# Patient Record
Sex: Male | Born: 1935 | Race: White | Hispanic: No | Marital: Married | Smoking: Never smoker
Health system: Southern US, Community
[De-identification: ages and names within clinical notes are randomized; demographics above are authoritative.]

## PROBLEM LIST (undated history)

## (undated) DIAGNOSIS — L570 Actinic keratosis: Secondary | ICD-10-CM

## (undated) DIAGNOSIS — I10 Essential (primary) hypertension: Secondary | ICD-10-CM

## (undated) DIAGNOSIS — C4491 Basal cell carcinoma of skin, unspecified: Secondary | ICD-10-CM

## (undated) HISTORY — DX: Actinic keratosis: L57.0

---
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
## 1990-10-02 HISTORY — PX: POLYPECTOMY: SHX149

## 2006-04-27 ENCOUNTER — Ambulatory Visit: Payer: Self-pay | Admitting: Unknown Physician Specialty

## 2010-09-20 ENCOUNTER — Ambulatory Visit: Payer: Self-pay | Admitting: Unknown Physician Specialty

## 2010-09-21 LAB — PATHOLOGY REPORT

## 2012-04-17 ENCOUNTER — Inpatient Hospital Stay: Payer: Self-pay | Admitting: Internal Medicine

## 2012-04-17 LAB — CBC WITH DIFFERENTIAL/PLATELET
Basophil #: 0.1 10*3/uL (ref 0.0–0.1)
Basophil %: 0.6 %
Eosinophil #: 0 10*3/uL (ref 0.0–0.7)
HCT: 31.1 % — ABNORMAL LOW (ref 40.0–52.0)
HGB: 10.7 g/dL — ABNORMAL LOW (ref 13.0–18.0)
Lymphocyte #: 2.3 10*3/uL (ref 1.0–3.6)
Lymphocyte %: 15.9 %
MCH: 30.9 pg (ref 26.0–34.0)
MCHC: 34.4 g/dL (ref 32.0–36.0)
MCV: 90 fL (ref 80–100)
Monocyte #: 0.6 x10 3/mm (ref 0.2–1.0)
Neutrophil #: 11.7 10*3/uL — ABNORMAL HIGH (ref 1.4–6.5)
Platelet: 241 10*3/uL (ref 150–440)
RDW: 13.7 % (ref 11.5–14.5)
WBC: 14.8 10*3/uL — ABNORMAL HIGH (ref 3.8–10.6)

## 2012-04-17 LAB — COMPREHENSIVE METABOLIC PANEL
Albumin: 3.6 g/dL (ref 3.4–5.0)
Alkaline Phosphatase: 62 U/L (ref 50–136)
Calcium, Total: 8.9 mg/dL (ref 8.5–10.1)
Co2: 24 mmol/L (ref 21–32)
Creatinine: 1.25 mg/dL (ref 0.60–1.30)
EGFR (Non-African Amer.): 56 — ABNORMAL LOW
Glucose: 141 mg/dL — ABNORMAL HIGH (ref 65–99)
Osmolality: 298 (ref 275–301)
Potassium: 3.6 mmol/L (ref 3.5–5.1)
SGPT (ALT): 25 U/L
Total Protein: 6.4 g/dL (ref 6.4–8.2)

## 2012-04-17 LAB — PROTIME-INR: INR: 1

## 2012-04-17 LAB — HEMOGLOBIN: HGB: 8.6 g/dL — ABNORMAL LOW (ref 13.0–18.0)

## 2012-04-18 LAB — CBC WITH DIFFERENTIAL/PLATELET
Basophil #: 0.1 10*3/uL (ref 0.0–0.1)
Basophil %: 1 %
Eosinophil #: 0.1 10*3/uL (ref 0.0–0.7)
HCT: 22.6 % — ABNORMAL LOW (ref 40.0–52.0)
HGB: 7.5 g/dL — ABNORMAL LOW (ref 13.0–18.0)
Lymphocyte #: 2.5 10*3/uL (ref 1.0–3.6)
MCH: 30.3 pg (ref 26.0–34.0)
MCHC: 33.1 g/dL (ref 32.0–36.0)
MCV: 92 fL (ref 80–100)
Monocyte #: 0.5 x10 3/mm (ref 0.2–1.0)
Monocyte %: 5 %
Neutrophil #: 6.4 10*3/uL (ref 1.4–6.5)
Neutrophil %: 66.8 %
Platelet: 152 10*3/uL (ref 150–440)
RBC: 2.47 10*6/uL — ABNORMAL LOW (ref 4.40–5.90)

## 2012-04-18 LAB — COMPREHENSIVE METABOLIC PANEL
Alkaline Phosphatase: 44 U/L — ABNORMAL LOW (ref 50–136)
Anion Gap: 6 — ABNORMAL LOW (ref 7–16)
BUN: 48 mg/dL — ABNORMAL HIGH (ref 7–18)
Bilirubin,Total: 0.3 mg/dL (ref 0.2–1.0)
Chloride: 117 mmol/L — ABNORMAL HIGH (ref 98–107)
Co2: 25 mmol/L (ref 21–32)
Creatinine: 1.13 mg/dL (ref 0.60–1.30)
EGFR (African American): >60
EGFR (Non-African Amer.): >60
Glucose: 89 mg/dL (ref 65–99)
Osmolality: 306 (ref 275–301)
Potassium: 4.2 mmol/L (ref 3.5–5.1)
Sodium: 148 mmol/L — ABNORMAL HIGH (ref 136–145)
Total Protein: 4.2 g/dL — ABNORMAL LOW (ref 6.4–8.2)

## 2012-04-18 LAB — HEMOGLOBIN: HGB: 8.1 g/dL — ABNORMAL LOW (ref 13.0–18.0)

## 2012-04-19 LAB — BASIC METABOLIC PANEL
Anion Gap: 9 (ref 7–16)
BUN: 25 mg/dL — ABNORMAL HIGH (ref 7–18)
Creatinine: 0.96 mg/dL (ref 0.60–1.30)
EGFR (African American): >60
EGFR (Non-African Amer.): >60
Glucose: 83 mg/dL (ref 65–99)
Osmolality: 296 (ref 275–301)
Potassium: 3.4 mmol/L — ABNORMAL LOW (ref 3.5–5.1)

## 2012-04-19 LAB — HEMOGLOBIN: HGB: 7.8 g/dL — ABNORMAL LOW (ref 13.0–18.0)

## 2012-04-20 LAB — CBC WITH DIFFERENTIAL/PLATELET
Basophil #: 0.1 10*3/uL (ref 0.0–0.1)
Basophil %: 1.3 %
Eosinophil #: 0.2 10*3/uL (ref 0.0–0.7)
Eosinophil %: 3.5 %
HCT: 23.4 % — ABNORMAL LOW (ref 40.0–52.0)
HGB: 8.1 g/dL — ABNORMAL LOW (ref 13.0–18.0)
Lymphocyte %: 33.1 %
MCV: 91 fL (ref 80–100)
Monocyte %: 6.5 %
Neutrophil %: 55.6 %
Platelet: 143 10*3/uL — ABNORMAL LOW (ref 150–440)
RDW: 14.1 % (ref 11.5–14.5)
WBC: 6.2 10*3/uL (ref 3.8–10.6)

## 2012-04-20 LAB — BASIC METABOLIC PANEL
Anion Gap: 9 (ref 7–16)
Calcium, Total: 7.8 mg/dL — ABNORMAL LOW (ref 8.5–10.1)
Chloride: 114 mmol/L — ABNORMAL HIGH (ref 98–107)
Co2: 23 mmol/L (ref 21–32)
Creatinine: 0.9 mg/dL (ref 0.60–1.30)
EGFR (African American): >60
Osmolality: 292 (ref 275–301)
Sodium: 146 mmol/L — ABNORMAL HIGH (ref 136–145)

## 2012-10-10 ENCOUNTER — Ambulatory Visit: Payer: Self-pay | Admitting: Unknown Physician Specialty

## 2014-04-09 ENCOUNTER — Emergency Department: Payer: Self-pay | Admitting: Emergency Medicine

## 2014-04-09 LAB — URINALYSIS, COMPLETE
BACTERIA: NONE SEEN
Bilirubin,UR: NEGATIVE
Glucose,UR: NEGATIVE mg/dL (ref 0–75)
LEUKOCYTE ESTERASE: NEGATIVE
Nitrite: NEGATIVE
Ph: 5 (ref 4.5–8.0)
SQUAMOUS EPITHELIAL: NONE SEEN
Specific Gravity: 1.033 (ref 1.003–1.030)

## 2014-04-09 LAB — CBC
HCT: 49.2 % (ref 40.0–52.0)
HGB: 16.8 g/dL (ref 13.0–18.0)
MCH: 31.5 pg (ref 26.0–34.0)
MCHC: 34.2 g/dL (ref 32.0–36.0)
MCV: 92 fL (ref 80–100)
Platelet: 210 10*3/uL (ref 150–440)
RBC: 5.34 10*6/uL (ref 4.40–5.90)
RDW: 13.7 % (ref 11.5–14.5)
WBC: 15.8 10*3/uL — AB (ref 3.8–10.6)

## 2014-04-09 LAB — COMPREHENSIVE METABOLIC PANEL
ALT: 37 U/L (ref 12–78)
Albumin: 3.9 g/dL (ref 3.4–5.0)
Alkaline Phosphatase: 83 U/L
Anion Gap: 9 (ref 7–16)
BUN: 21 mg/dL — ABNORMAL HIGH (ref 7–18)
Bilirubin,Total: 0.8 mg/dL (ref 0.2–1.0)
CHLORIDE: 101 mmol/L (ref 98–107)
Calcium, Total: 9.3 mg/dL (ref 8.5–10.1)
Co2: 29 mmol/L (ref 21–32)
Creatinine: 1.45 mg/dL — ABNORMAL HIGH (ref 0.60–1.30)
EGFR (Non-African Amer.): 46 — ABNORMAL LOW
GFR CALC AF AMER: 54 — AB
Glucose: 125 mg/dL — ABNORMAL HIGH (ref 65–99)
Osmolality: 282 (ref 275–301)
Potassium: 3.3 mmol/L — ABNORMAL LOW (ref 3.5–5.1)
SGOT(AST): 25 U/L (ref 15–37)
Sodium: 139 mmol/L (ref 136–145)
Total Protein: 7.7 g/dL (ref 6.4–8.2)

## 2014-11-18 DIAGNOSIS — L259 Unspecified contact dermatitis, unspecified cause: Secondary | ICD-10-CM | POA: Diagnosis not present

## 2015-01-19 NOTE — Op Note (Signed)
PATIENT NAME:  Caleb Maldonado, Caleb Maldonado MR#:  [id number] DATE OF BIRTH:  May 17, 1936  DATE OF PROCEDURE:  04/18/2012  PREOPERATIVE DIAGNOSES:  1. Symptomatic lower gastrointestinal bleeding with hypotension requiring three units of packed red blood cell transfusion.  2. Baseline chronic hypertension.  3. History of bleeding ulcers.   POSTOPERATIVE DIAGNOSES:  1. Symptomatic lower gastrointestinal bleeding with hypotension requiring three units of packed red blood cell transfusion.  2. Baseline chronic hypertension.  3. History of bleeding ulcers.   PROCEDURES:    1. Catheter placement into two IMA branches, one into the sigmoid artery and one into the distal left colon and proximal sigmoid.  2. Aortogram and selective IMA arteriogram.  3. Microbead embolization with approximately 1 mL of 300-500 micron beads into the two above-named branches.  4. StarClose closure device, right femoral artery.   SURGEON: Algernon Huxley, M.D.   ANESTHESIA: Local with moderate conscious sedation.   ESTIMATED BLOOD LOSS: Minimal.   INDICATION FOR PROCEDURE: The patient is a 79 year old white male with lower gastrointestinal bleeding. He has received 3 units of packed red blood cells. He has hypotension as part of his bleeding and had a positive bleeding scan for what appeared to be the sigmoid colon. He is brought down for angiography with further plans for treatment for embolization of this area. Risks and benefits were discussed. Informed consent was obtained.   DESCRIPTION OF PROCEDURE: The patient was brought to the vascular interventional radiology suite. Groins were shaved and prepped and a sterile surgical field was created. The right femoral head was localized with fluoroscopy and the right femoral artery was accessed without difficulty with a Seldinger needle. J-wire and 5 French sheath was placed. A pigtail catheter was placed in the aorta at the L1 level and AP aortogram was performed. This showed normal  origins of the renals, the celiac, SMA and IMA were all seen. There was no flow-limiting stenosis in the aortoiliac segments. I then performed an RAO projection arteriogram and in one magnified view which delineated the origin of the IMA. With this I was able to cannulate this without difficulty with a VS-1 catheter into the main IMA. There were two branches that would be supplying the sigmoid colon and one at the distal left colon as well as the proximal sigmoid colon. The more distal branch was seen only in the sigmoid colon. I was able to selectively cannulate each of these with a prograde microcatheter and into both I instilled approximately one-half milliliter of 300-500 micron polyvinyl alcohol beads. The main vessels were still open following treatment, but the flow out into the peripheral vessels was sluggish. At this point I elected to terminate the procedure. The diagnostic catheter was removed. Oblique arteriogram was performed in the right femoral artery and StarClose closure device was deployed in the usual fashion with excellent hemostatic result. The patient tolerated the procedure well and was taken to the recovery room in stable condition.   ____________________________ Algernon Huxley, MD jsd:ap D: 04/18/2012 15:16:56 ET T: 04/18/2012 15:39:59 ET JOB#: [id number]  cc: Algernon Huxley, MD, <Dictator> Algernon Huxley MD ELECTRONICALLY SIGNED 04/22/2012 16:19

## 2015-01-19 NOTE — H&P (Signed)
PATIENT NAME:  Caleb Maldonado, Caleb Maldonado MR#:  [id number] DATE OF BIRTH:  [birth year]  DATE OF ADMISSION:  04/17/2012  PRIMARY DOCTOR: Einar Pheasant, MD   ER PHYSICIAN: Belva Bertin, MD   CHIEF COMPLAINT: Rectal bleeding.   HISTORY OF PRESENT ILLNESS: The patient is a 79 year old male with hypertension who came in because he had an episode of bright red blood from the rectum this morning and the patient was noted to have a black stool last night. This morning it is fresh blood from his rectum. The patient had almost two history of dark stools and had a normal bowel movement two days ago and again started to have a black stool last night and bright red blood from rectum this morning and he was about to pass out, felt very dizzy and diaphoretic and EMS came. The patient had a colonoscopy two years ago by Dr. Vira Agar that showed polyps in transverse colon and sigmoid colon which was remote. He has a strong family history of colon cancer in mother and brothers. The patient mainly complains of some pain in the right lower quadrant, not radiating type, and has no fever. No recent constipation. No vomiting. No nausea. No diarrhea recently.   PAST MEDICAL HISTORY:  1. Hypertension. 2. History of kidney stones. 3. Colon polyps removed.   ALLERGIES: No known allergies.   SOCIAL HISTORY: Previous smoker, quit 30 years ago. No alcohol. No drugs.   PAST SURGICAL HISTORY: None.   FAMILY HISTORY: Colon cancer in mother and brothers.   MEDICATIONS: 1. Lisinopril 40 mg daily.  2. Triamterene/HCTZ 37.5/25 half tablet daily.  The patient took these two medications this morning. He also takes aspirin but he didn't take it this morning.   REVIEW OF SYSTEMS: CONSTITUTIONAL: Has fatigue and weakness. EYES: No blurred vision. No cataracts. No glaucoma. ENT: No tinnitus. No epistaxis. No difficulty swallowing. RESPIRATORY: No cough. No wheezing. CARDIOVASCULAR: No chest pain. No orthopnea. GI: Has rectal bleeding. Has had  no change in bowel habits recently. GU: No dysuria. ENDOCRINE: No polyuria, nocturia. HEMATOLOGIC: No anemia. INTEGUMENTARY: No skin rashes. MUSCULOSKELETAL: No joint pain. NEUROLOGIC: No numbness. No weakness. No TIAs. PSYCH: No anxiety.   PHYSICAL EXAMINATION:   GENERAL: 79 year old male patient answering questions appropriately, looks dehydrated.   VITAL SIGNS: Temperature normal, heart rate 90, respirations 23, initial blood pressure 91/64, pulse stayed low, most recent one 101/59, heart rate 80.   HEAD: Atraumatic, normocephalic. Pupils equally reacting to light. Extraocular movements are intact. No conjunctivitis. Hearing is intact. No tympanic membrane congestion. Mucosal membranes are dry. Conjunctiva has pale color.   NECK: Thyroid nontender, not enlarged. No JVD. No carotid bruit. Supple.   RESPIRATORY: Clear to auscultation. No wheeze. No rales.   CARDIOVASCULAR: S1, S2 regular. No murmurs.   ABDOMEN: Soft.  Slight right lower quadrant tenderness present bowel sounds present. No hernias. No CVA tenderness.   MUSCULOSKELETAL: Strength 5/5 upper and lower extremities.   SKIN: No skin rashes.   NEUROLOGIC: Cranial nerves II through XII intact. No dysarthria or aphasia. Sensations are intact.   LABORATORY, DIAGNOSTIC, AND RADIOLOGICAL DATA: Nuclear medicine bleeding scan is negative for any lower GI bleeding. WBC 14.8, hemoglobin 10.7, hematocrit 31.1, platelets 241. Electrolytes sodium 140, potassium 3.6, chloride 107, bicarb 24, BUN 58, creatinine 1.25, blood glucose 141. Liver functions within normal limits.   EKG showed sinus rhythm with possible premature atrial complexes, 91 beats per minute. No ST-T changes.   ASSESSMENT AND PLAN:  1. The patient is  a 79 year old male with rectal bleeding, possibly lower GI bleed with history of polyps before. The patient complained of black stools two days before so we cannot completely exclude upper GI source as well. I spoke with Dr. Candace Cruise  who recommended to continue Protonix drip and fluids. The patient will be admitted to ICU because of hypotension and hemodynamic instability. He will be on IV fluids at 150 mL/h. Check CBC every eight hours and hold aspirin and possible colonoscopy either today or tomorrow. The patient's bleeding scan has been negative. He has slight tenderness in the right lower quadrant with elevated white count. It may probably be diverticular bleed as well. We are going to start empirically on Levaquin and Flagyl as well. Monitor CBC q.8. Avoid any blood thinners.  2. Dehydration secondary to GI bleed. Continue IV fluids. Hold BP medications and recheck BMP and vitals.   Spoke with the patient and the patient's wife at bedside.   TIME SPENT ON HISTORY AND PHYSICAL: About 60 minutes.   ____________________________ Epifanio Lesches, MD sk:drc D: 04/17/2012 14:41:00 ET T: 04/17/2012 14:57:43 ET JOB#: [id number]  cc: Epifanio Lesches, MD, <Dictator> Einar Pheasant, MD Epifanio Lesches MD ELECTRONICALLY SIGNED 04/26/2012 21:31

## 2015-01-19 NOTE — Consult Note (Signed)
Patient admitted with brisk lower GI bleeding and hypotension.  Required 3 units of PRBC.  Hgb 8.5 now.  BP still around 90/50. Bleeding scan initially negative, but this am was positive for the sigmoid colon.  Discussed risks and benefits of emoblization.  They desire to have this done and will schedule for today  Electronic Signatures: Algernon Huxley (MD)  (Signed on 18-Jul-13 13:46)  Authored  Last Updated: 18-Jul-13 13:46 by Algernon Huxley (MD)

## 2015-01-19 NOTE — Consult Note (Signed)
Chief Complaint:   Subjective/Chief Complaint Much better. No abd pain. No bleeding. Hgb relatively stable now. Diet being advanced.   VITAL SIGNS/ANCILLARY NOTES: **Vital Signs.:   19-Jul-13 15:00   Vital Signs Type Routine   Brief Assessment:   Respiratory clear BS    Gastrointestinal Normal   Lab Results: Routine Chem:  19-Jul-13 05:07    Glucose, Serum 83   BUN  25   Creatinine (comp) 0.96   Sodium, Serum  147   Potassium, Serum  3.4   Chloride, Serum  116   CO2, Serum 22   Calcium (Total), Serum  7.4   Anion Gap 9   Osmolality (calc) 296   eGFR (African American) >60   eGFR (Non-African American) >60 (eGFR values <40m/min/1.73 m2 may be an indication of chronic kidney disease (CKD). Calculated eGFR is useful in patients with stable renal function. The eGFR calculation will not be reliable in acutely ill patients when serum creatinine is changing rapidly. It is not useful in  patients on dialysis. The eGFR calculation may not be applicable to patients at the low and high extremes of body sizes, pregnant women, and vegetarians.)  Routine Hem:  19-Jul-13 05:07    Hemoglobin (CBC)  8.5 (Result(s) reported on 19 Apr 2012 at 05:32AM.)   Assessment/Plan:  Assessment/Plan:   Assessment LGI bleeding from sigmoid colon. S/P embolectomy. Stable now.    Plan Advance diet as tolerated. Moniter hgb. If remains stable, hopefully discharge soon. WIll sign off. Call GI if condition changes. Thanks   Electronic Signatures: OVerdie Shire(MD)  (Signed 19-Jul-13 12:50)  Authored: Chief Complaint, VITAL SIGNS/ANCILLARY NOTES, Brief Assessment, Lab Results, Assessment/Plan   Last Updated: 19-Jul-13 12:50 by OVerdie Shire(MD)

## 2015-01-19 NOTE — Consult Note (Signed)
Brief Consult Note: Diagnosis: gi bleeding.   Patient was seen by consultant.   Consult note dictated.   Comments: Appreciate consult for 79 y/o caucasian man with history of HTN, kidney stones, and adenomatous polyps, admitted for rectal bleeding, for evaluation of the same. Noted negative GI bleeding scan, hemoglobing o 10.7, Bun of 58 with normal creatinine. Patient reports seeing some black stools, which he initially attributed to chocolate, as far back as a month ago. Last seen this last Sun & Mon. Reports red blood from rectum, last this am. Reports occasional rql discomfort. Does feel weak, and reports some dizziness with exertion.  Denies NSAIDs other than asa 81mg  po qd, knowledge of previous  H pylori infection, other abdominal dsicomfort, acid reflux, heartburn, indigestion, NVD, early satiety, problems swalllowing, bloating. State he has never had an EGD.  Colonoscopy last 2011 with adenomatous polyps and internal hemorrhoids, no mention of diverticula. Impression and plan: GI bleeding: with increased BUN and normal creatinine, and history of black stools, cannot rule out an upper GI source. Did find in chart review that patient's hgb has been around 16-16.5 for the last few years, and history of PUD 1977. Agree with Pantoprazole gtt. Will order serial hemoglobins, and PT/INR. Agree with transfusing prn. Will plan for EGD. Risks and benefits discussed with patient.  Electronic Signatures: Stephens November H (NP)  (Signed 17-Jul-13 16:34)  Authored: Brief Consult Note   Last Updated: 17-Jul-13 16:34 by Theodore Demark (NP)

## 2015-01-19 NOTE — Discharge Summary (Signed)
PATIENT NAME:  Caleb Maldonado, Caleb Maldonado MR#:  [id number] DATE OF BIRTH:  04-25-1936  DATE OF ADMISSION:  04/17/2012 DATE OF DISCHARGE:  04/20/2012  PRIMARY CARE PHYSICIAN:  Dr. Einar Pheasant  DISCHARGE DIAGNOSES:  1. Lower GI bleed.  2. Hypertension.  3. Acute blood loss anemia. 4. Hypernatremia.  5. Hypokalemia.  6. Hypocalcemia secondary to hypoalbuminemia.  7. Macular allergic rash.   CONSULTS:  1. Dr. Candace Cruise of gastroenterology. 2. Dr. Lucky Cowboy of vascular surgery.   PROCEDURES: Angiography and embolization of sigmoid artery and distal proximal sigmoid along with aortogram.   ADMITTING HISTORY AND PHYSICAL: Please see detailed History and Physical dictated on 04/17/2012.  In brief, this is a 79 year old male patient with hypertension who presented to the Emergency Room after having an episode of bright red blood per rectum. The patient also noticed some black stool the previous night. The patient was found to have blood pressure in the low normal range and was admitted for further management to the Critical Care Unit. The patient had multiple blood transfusions, but over the last 48 hours of his stay his anemia has been stable with hemoglobin around 8. Initially Dr. Candace Cruise of gastroenterology was consulted who suggested a bleeding scan which was positive. Dr. Lucky Cowboy of vascular surgery was consulted who did an aortogram with arteriogram and embolization of two vessels of two IMA branches.   The patient has had hypotension into the 80s. Mostly his blood pressure has been between 95 and 010 systolic.  The patient has a history of hypertension and seems to have been overmedicated with blood pressures at this point. He does not have any lightheadedness.  Last blood pressure recorded is 105/60 with normal pulse. The patient is not feeling any dizziness on standing up and is being discharged home in stable condition with a normal cardiovascular and abdominal exam.   DISCHARGE MEDICATIONS:  Centrum multivitamin tablet  oral once a day.   DISCHARGE INSTRUCTIONS: Follow up with Dr. Einar Pheasant, primary care physician, for followup on his blood pressure. The patient is to return to the Emergency Room if he notices any blood pressures less than 90 systolic at home. He has been instructed to check his blood pressure twice a day, keep a log, and take to the primary care physician's office. He will be on a regular diet of regular consistency. No restrictions on activity. This plan was discussed with the patient and his wife at bedside who verbalized understanding and are okay with the plan.   TIME SPENT: Time spent today on this discharge dictation along with coordinating care and counseling of the patient was 40 minutes.    ____________________________ Leia Alf Doranne Schmutz, MD srs:bjt D: 04/20/2012 09:39:27 ET T: 04/22/2012 10:49:00 ET JOB#: [id number]  cc: Alveta Heimlich R. Sadiyah Kangas, MD, <Dictator> [place] MD ELECTRONICALLY SIGNED 05/01/2012 13:51

## 2015-01-19 NOTE — Consult Note (Signed)
PATIENT NAME:  Caleb Maldonado, Caleb Maldonado MR#:  [id number] DATE OF BIRTH:  [birth year]  DATE OF CONSULTATION:  04/18/2012  REFERRING PHYSICIAN:  Dr. Verdie Shire from gastroenterology  CONSULTING PHYSICIAN:  Algernon Huxley, MD  REASON FOR CONSULTATION: GI bleed.   HISTORY OF PRESENT ILLNESS: This is a 79 year old white male who was admitted last night with brisk lower GI bleeding. This was painless. It came on without warning. He has had lots of bright red blood per rectum. He has now received 3 units of packed red blood cells but his hemoglobin is still only 8.5. The bleeding has slowed down. He has not had as much bleeding today as yesterday. His bleeding scan which was initially negative was repeated this morning and found to be positive for the sigmoid colon. As such we are consulted for evaluation for embolization. He previously has a history of polyps in the transverse sigmoid colon which was remote, had a colonoscopy approximately two years ago without any obvious signs of significant pathology.   PAST MEDICAL HISTORY:  1. Hypertension.  2. Kidney stones.  3. Colon polyps.   PAST SURGICAL HISTORY: Colon polypectomy.   SOCIAL HISTORY: Previous smoker, quit 30 years ago. No alcohol or drug use.   FAMILY HISTORY: He has multiple family members with colon cancer including mother and siblings.    ALLERGIES: None known.   HOME MEDICATIONS:  1. Lisinopril 40 mg daily.  2. Triamterene/HCTZ 37.5/25 half tablet daily.  3. Aspirin daily.   REVIEW OF SYSTEMS: GENERAL: He feels fatigued and weak. He does not have fevers or chills. No intentional weight loss or gain. EYES: No blurred or double vision. EARS: No tinnitus or ear pain. CARDIOVASCULAR: No palpitations or chest pain. RESPIRATORY: No shortness breath or cough. GASTROINTESTINAL: As per history of present illness. GENITOURINARY: No dysuria or hematuria. ENDOCRINE: No heat or cold intolerance. HEME: Positive for GI bleeding and anemia. MUSCULOSKELETAL: No  joint pain or swelling. NEUROLOGIC: No transient ischemic attack, stroke or seizure. PSYCH: No anxiety or depression.   PHYSICAL EXAMINATION:  GENERAL: This is a well-developed, well-nourished white male who is lying quietly in the Critical Care Unit.   VITAL SIGNS: Temperature 98.1, pulse 78, blood pressure 93/51, saturations 99% on room air.   HEAD: Normocephalic and atraumatic.   EYES: Sclerae nonicteric. Conjunctivae are clear.   EARS: Normal external appearance. Hearing intact.   NECK: Supple without adenopathy or jugular venous distention.   HEART: Regular rate and rhythm without murmurs, rubs, or gallops.   LUNGS: Clear to auscultation bilaterally.   ABDOMEN: Soft, nondistended. He really does not have significant tenderness. There is no rebound or guarding.   EXTREMITIES: Mild lower extremity edema. Feet are warm and well perfused. Pedal pulses are 1+.  NEUROLOGICAL: Normal strength and tone in all four extremities.   SKIN: Warm and dry.   LABORATORY, DIAGNOSTIC, AND RADIOLOGICAL DATA: Sodium 148, potassium 4.2, chloride 117, CO2 25, BUN 48, creatinine 1.13, glucose 89. White blood cell count this morning was 9.6 and his platelet count was 152,000. He has a current hemoglobin of 8.5, it was 7.5 this morning and he received another unit of packed red blood cells. He has received a total of 3 units of packed red blood cells. Bleeding scan as described above.   ASSESSMENT AND PLAN: This is a 79 year old white male with a positive bleeding scan for the sigmoid colon. This is in association with hypotension and a 3 unit packed red blood cell transfusion. Blood loss  so far with continued anemia. I think options for treatment would include surgical resection of this area or embolization. Embolization is certainly the less invasive of the two. We discussed the risk of rebleeding, ischemia, access related complications, nephrotoxicity with the patient and the family. We also discussed  observation as an option. At this time they would like to proceed with angiography. This will be performed today.   This is a level-4 consultation.  ____________________________ Algernon Huxley, MD jsd:cms D: 04/18/2012 16:07:33 ET T: 04/18/2012 16:23:56 ET JOB#: [id number]  cc: Algernon Huxley, MD, <Dictator>  Algernon Huxley MD ELECTRONICALLY SIGNED 04/22/2012 16:19

## 2015-01-19 NOTE — Consult Note (Signed)
Chief Complaint:   Subjective/Chief Complaint Feels better after 3 units of PRBC. Was hypotensive last night with drop in hgb although there was no active bleeding. Bleeding scan repeated this AM. The scan is now positive near sigmoid area?   VITAL SIGNS/ANCILLARY NOTES: **Vital Signs.:   18-Jul-13 09:21   Pulse Pulse 78   Respirations Respirations 14   Systolic BP Systolic BP 174   Diastolic BP (mmHg) Diastolic BP (mmHg) 55   Mean BP 73   Brief Assessment:   Cardiac Regular    Respiratory clear BS    Gastrointestinal Normal   Lab Results: Hepatic:  18-Jul-13 05:11    Bilirubin, Total 0.3   Alkaline Phosphatase  44   SGPT (ALT) 15 (12-78 NOTE: NEW REFERENCE RANGE 08/25/2011)   SGOT (AST)  11   Total Protein, Serum  4.2   Albumin, Serum  2.3  Routine Chem:  18-Jul-13 05:11    Glucose, Serum 89   BUN  48   Creatinine (comp) 1.13   Sodium, Serum  148   Potassium, Serum 4.2   Chloride, Serum  117   CO2, Serum 25   Calcium (Total), Serum  7.1   Osmolality (calc) 306   eGFR (African American) >60   eGFR (Non-African American) >60 (eGFR values <58m/min/1.73 m2 may be an indication of chronic kidney disease (CKD). Calculated eGFR is useful in patients with stable renal function. The eGFR calculation will not be reliable in acutely ill patients when serum creatinine is changing rapidly. It is not useful in  patients on dialysis. The eGFR calculation may not be applicable to patients at the low and high extremes of body sizes, pregnant women, and vegetarians.)   Anion Gap  6  Routine Hem:  18-Jul-13 05:11    Hemoglobin (CBC)  7.5   WBC (CBC) 9.6   RBC (CBC)  2.47   Hematocrit (CBC)  22.6   Platelet Count (CBC) 152   MCV 92   MCH 30.3   MCHC 33.1   RDW 13.9   Neutrophil % 66.8   Lymphocyte % 25.8   Monocyte % 5.0   Eosinophil % 1.4   Basophil % 1.0   Neutrophil # 6.4   Lymphocyte # 2.5   Monocyte # 0.5   Eosinophil # 0.1   Basophil # 0.1 (Result(s)  reported on 18 Apr 2012 at 05:34AM.)    09:17    Hemoglobin (CBC)  8.5 (Result(s) reported on 18 Apr 2012 at 09:34AM.)   Radiology Results: Nuclear Med:    18-Jul-13 09:54, GI Blood Loss Study - Nuc Med   GI Blood Loss Study - Nuc Med    REASON FOR EXAM:    active bleeding  COMMENTS:       PROCEDURE: NM  - NM GI BLOOD LOSS STUDY  - Apr 18 2012  9:54AM     RESULT: Additional imaging was performed today 18 April 2012 taking   advantage of residual activity within the bowel from yesterday's GI   bleeding study.    On today's study there is considerable activity within the right colon   and transverse colon with milder amounts of activity more inferiorly.   There is a focus of increased activity which appears in the proximal to   mid sigmoid colon over the course of today's 1 hour of imaging. This may   reflect a small focus of active bleeding. Reportedly the patient has not   had a bloody stool today.  IMPRESSION:  There  may be a small focus of abnormal gastrointestinal   bleeding in the proximal to mid sigmoid colon.          Verified By: DAVID A. Martinique, M.D., MD   Assessment/Plan:  Assessment/Plan:   Assessment GI bleeding. Positive bleeding scan. Hx of colon polyps. No mention of tics or AVM's on previous colonoscopy report in 2011.Nevertheless, suspect diverticular bleed.    Plan Discussed case with Dr. Delana Meyer, who will see patient today for possible angiography. Hold off on performing EGD today.   Electronic Signatures: Verdie Shire (MD)  (Signed 18-Jul-13 12:00)  Authored: Chief Complaint, VITAL SIGNS/ANCILLARY NOTES, Brief Assessment, Lab Results, Radiology Results, Assessment/Plan   Last Updated: 18-Jul-13 12:00 by Verdie Shire (MD)

## 2015-01-19 NOTE — Consult Note (Signed)
PATIENT NAME:  Caleb Maldonado, Caleb Maldonado MR#:  [id number] DATE OF BIRTH:  11-Jan-1936  DATE OF CONSULTATION:  04/17/2012  REFERRING PHYSICIAN:   CONSULTING PHYSICIAN:  Theodore Demark, NP  HISTORY OF PRESENT ILLNESS: Caleb Maldonado is a pleasant 79 year old man Caucasian man with a history of hypertension, hyperlipidemia, peptic ulcer disease dating back to 1977, kidney stones and adenomatous colonic polyps admitted today for rectal bleeding. GI has been consulted by Dr. Vianne Bulls regarding the same. Noted negative GI bleeding scan done earlier today. In chart review it appears that patient's hemoglobin for the last several years have been running around 16 to 16.5; today he presented with a hemoglobin of 10.7 and a BUN of 58,] and normal creatinine. Patient reports seeing some black stools which he initially attributed to chocolate as far back as about a month ago. Last black stools were seen most recently Sunday and Monday of this week. Reports red blood from rectum this morning and the last time this happened was this morning. Reports occasional right lower quadrant discomfort. Does feel some weakness and fatigue and reports some dizziness with exertion. Denies NSAIDs other than 81 mg aspirin daily. Knowledge of previous H. pylori infection, other abdominal discomfort, acid reflux, heartburn, indigestion, nausea, vomiting, diarrhea, early satiety, problems swallowing and bloating. States he has never had an EGD. Does have history of colonoscopy last 2011 with adenomatous polyps and internal hemorrhoids. No mention of diverticula.   PAST MEDICAL HISTORY:  1. Hypertension. 2. Kidney stones. 3. Adenomatous polyps. 4. Peptic ulcer disease 1977. 5. Hyperlipidemia.   PAST SURGICAL HISTORY: None.   ALLERGIES: No known allergies.   SOCIAL HISTORY: Lives with wife. Quit smoking 1980s. No alcohol. No illicits.   FAMILY HISTORY: Significant for colon cancer in mother and brothers. Negative for liver disease and  ulcers.   MEDICATIONS:  1. Lisinopril 40 mg p.o. daily.  2. Triamterene/hydrochlorothiazide 37.5/25, half tablet daily. 3. Aspirin 81 mg p.o. daily.   REVIEW OF SYSTEMS: 10 point review of systems negative other than what is mentioned above. In addition, there is no history of heart attack, stroke, diabetes, renal issues other than kidney stones, sleep apnea, problems with sedation. There is no history of arthroplasty. No history of cardiac surgery.   LABORATORY, DIAGNOSTIC AND RADIOLOGICAL DATA: Most recent lab work: Glucose 141, BUN 58, creatinine 1.25, sodium 140, potassium 3.6, chloride 107, calcium 8.9. Hepatic panel was normal. WBC 14.8, hemoglobin 10.7, hematocrit 31.1, platelets 241. GI bleeding scan was negative for bleeding in the lower intestine.    PHYSICAL EXAM:  VITAL SIGNS: Most recent vital signs: Temperature 98.1, pulse 106, respiratory rate 18, blood pressure 106/63.   GENERAL: 79 year old Caucasian male lying in bed, appears comfortable.   PSYCH: Logical thought, pleasant, cooperative.   HEENT: Normocephalic, atraumatic. No redness, drainage, or erythema to the eyes or nares. Oral mucous membranes are pink and moist.   NECK: No abnormalities. No thyromegaly, adenopathy, or JVD.   CHEST: Respirations eupneic. Lungs clear to auscultation bilaterally.   CARDIOVASCULAR: S1, S2. Regular rate and rhythm. No MRG. Sinus rhythm on the monitor just slight sinus tachycardia. No appreciable edema.   ABDOMEN: Nondistended. Bowel sounds x4. Soft. Minimal right lower quadrant tenderness. No peritoneal signs, rebound tenderness, hepatosplenomegaly, hernias, or masses.   GENITOURINARY: Deferred.   RECTAL: Deferred.   EXTREMITIES: Strength 5/5. Moves all extremities well x4. Sensation appears to be intact. No clubbing. No cyanosis.   SKIN: No erythema, lesion or rash.   NEUROLOGIC: Cranial nerves II  through XII grossly intact. Speech clear. No facial droop.   IMPRESSION AND  PLAN: GI bleeding with increased BUN and normal creatinine and history of black stools, cannot rule out upper GI source. Agree with pantoprazole drip. Will order serial hemoglobins and PT-INR. Agree with transfusing p.r.n. so I will order the type and screen and crossmatch him for 2 units. We will plan for EGD tomorrow. Risks and benefits discussed with patient. He is agreeable.   These services were provided by Stephens November, MSN, [facility] in collaboration with Dr. Verdie Shire with whom I have discussed the patient in full.  ____________________________ Theodore Demark, NP chl:cms D: 04/17/2012 16:33:18 ET T: 04/17/2012 16:48:39 ET JOB#: [id number]  cc: Theodore Demark, NP, <Dictator> [place] SIGNED 04/20/2012 0:31

## 2015-02-26 DIAGNOSIS — I10 Essential (primary) hypertension: Secondary | ICD-10-CM | POA: Diagnosis not present

## 2015-03-05 DIAGNOSIS — I10 Essential (primary) hypertension: Secondary | ICD-10-CM | POA: Diagnosis not present

## 2015-03-05 DIAGNOSIS — E876 Hypokalemia: Secondary | ICD-10-CM | POA: Diagnosis not present

## 2015-03-05 DIAGNOSIS — Z1283 Encounter for screening for malignant neoplasm of skin: Secondary | ICD-10-CM | POA: Diagnosis not present

## 2015-03-30 DIAGNOSIS — Z85828 Personal history of other malignant neoplasm of skin: Secondary | ICD-10-CM | POA: Diagnosis not present

## 2015-03-30 DIAGNOSIS — D229 Melanocytic nevi, unspecified: Secondary | ICD-10-CM | POA: Diagnosis not present

## 2015-03-30 DIAGNOSIS — L859 Epidermal thickening, unspecified: Secondary | ICD-10-CM | POA: Diagnosis not present

## 2015-03-30 DIAGNOSIS — L820 Inflamed seborrheic keratosis: Secondary | ICD-10-CM | POA: Diagnosis not present

## 2015-03-30 DIAGNOSIS — B079 Viral wart, unspecified: Secondary | ICD-10-CM | POA: Diagnosis not present

## 2015-03-30 DIAGNOSIS — L821 Other seborrheic keratosis: Secondary | ICD-10-CM | POA: Diagnosis not present

## 2015-03-30 DIAGNOSIS — Z1283 Encounter for screening for malignant neoplasm of skin: Secondary | ICD-10-CM | POA: Diagnosis not present

## 2015-03-30 DIAGNOSIS — L578 Other skin changes due to chronic exposure to nonionizing radiation: Secondary | ICD-10-CM | POA: Diagnosis not present

## 2015-04-02 DIAGNOSIS — E876 Hypokalemia: Secondary | ICD-10-CM | POA: Diagnosis not present

## 2015-09-01 DIAGNOSIS — Z0000 Encounter for general adult medical examination without abnormal findings: Secondary | ICD-10-CM | POA: Diagnosis not present

## 2015-09-01 DIAGNOSIS — Z131 Encounter for screening for diabetes mellitus: Secondary | ICD-10-CM | POA: Diagnosis not present

## 2015-09-01 DIAGNOSIS — I10 Essential (primary) hypertension: Secondary | ICD-10-CM | POA: Diagnosis not present

## 2015-09-01 DIAGNOSIS — Z13220 Encounter for screening for lipoid disorders: Secondary | ICD-10-CM | POA: Diagnosis not present

## 2015-09-07 DIAGNOSIS — I10 Essential (primary) hypertension: Secondary | ICD-10-CM | POA: Diagnosis not present

## 2015-09-07 DIAGNOSIS — Z0000 Encounter for general adult medical examination without abnormal findings: Secondary | ICD-10-CM | POA: Diagnosis not present

## 2015-12-10 IMAGING — CT CT STONE STUDY
2 of 4 series · 17 of 46 positions shown, 19 images · non-contrast
Comparison: None.

CLINICAL DATA: Bilateral low back pain extending into the lower
quadrant. Nausea. Stinging with urination. Left flank pain.

EXAM:
CT ABDOMEN AND PELVIS WITHOUT CONTRAST
TECHNIQUE: Multidetector CT imaging of the abdomen and pelvis was performed
following the standard protocol without IV contrast.

[Series 2: stone standard full · axial · 0.83mm/px · z∈[-468,+26]mm · 14 of 109 slices shown, 16 images]
[im 5/109  soft-tissue]
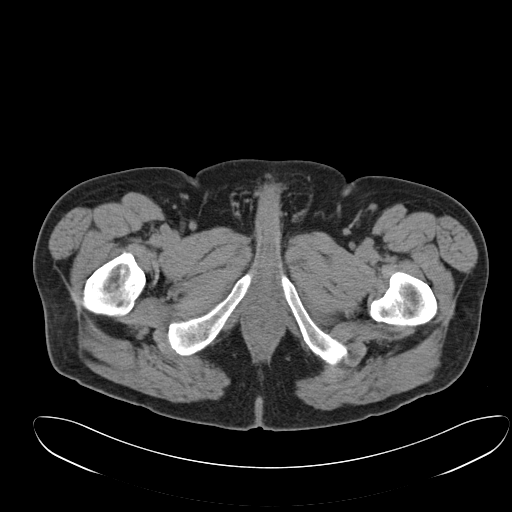
[im 5/109  bone]
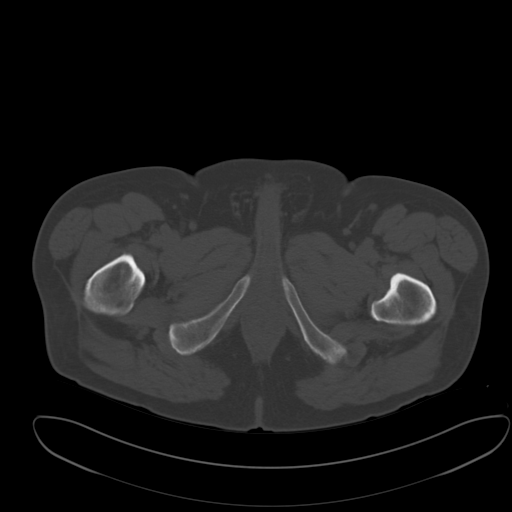
[im 15/109  soft-tissue]
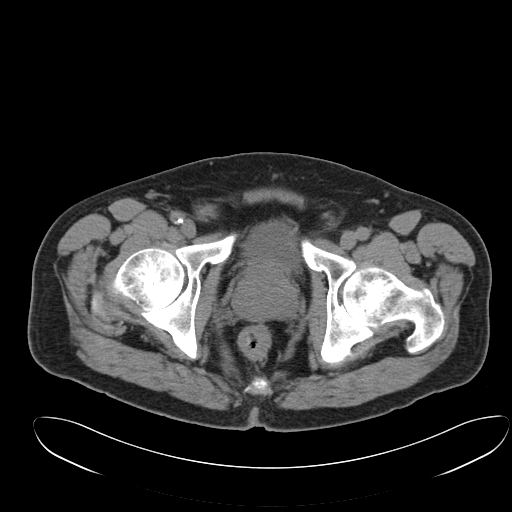
[im 19/109  soft-tissue]
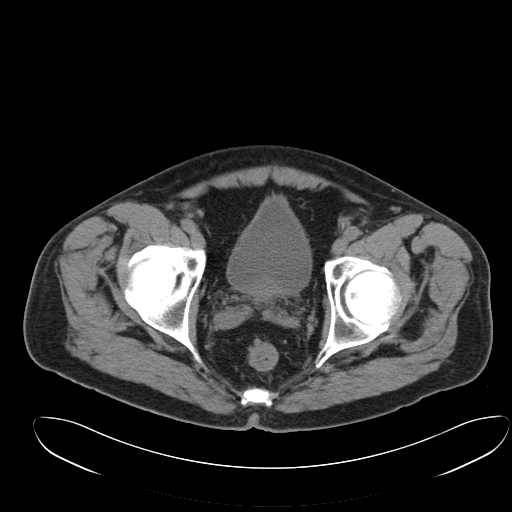
[im 29/109  soft-tissue]
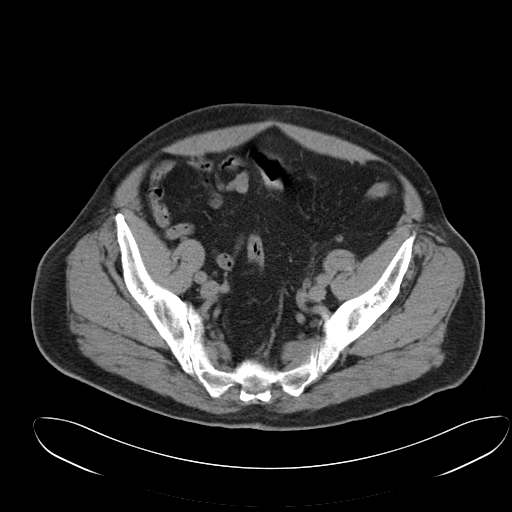
[im 38/109  soft-tissue]
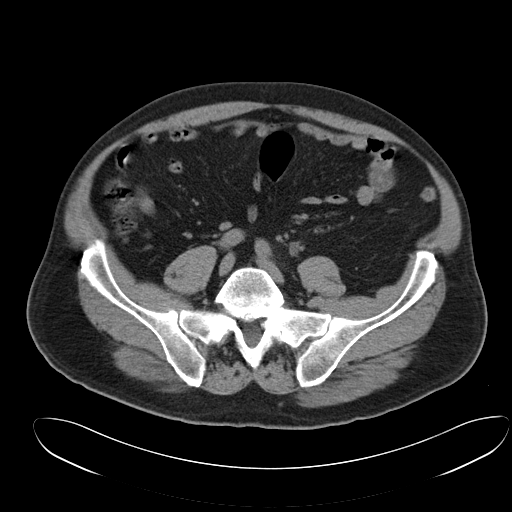
[im 43/109  soft-tissue]
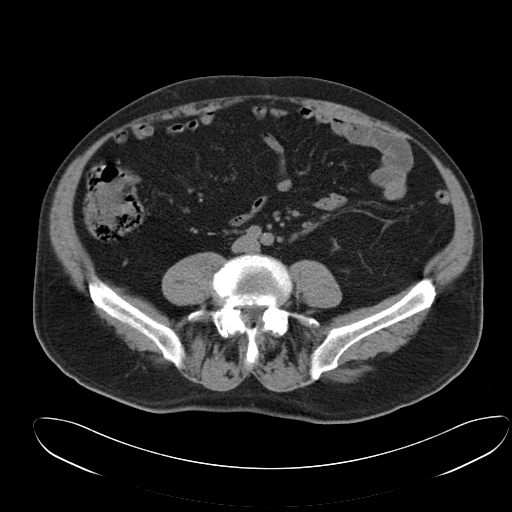
[im 52/109  soft-tissue]
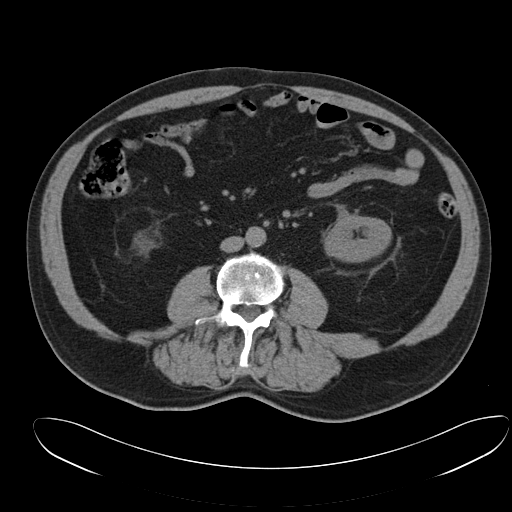
[im 57/109  soft-tissue]
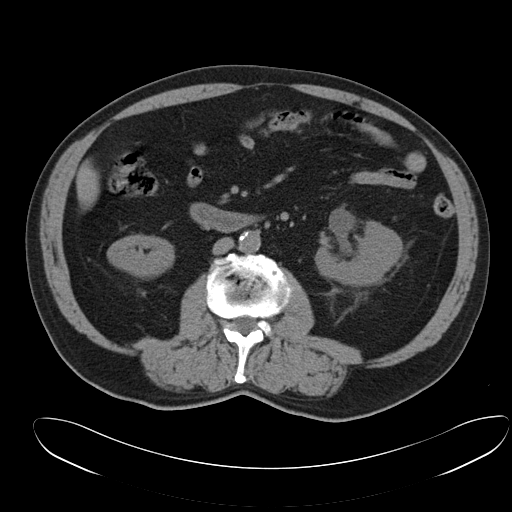
[im 66/109  soft-tissue]
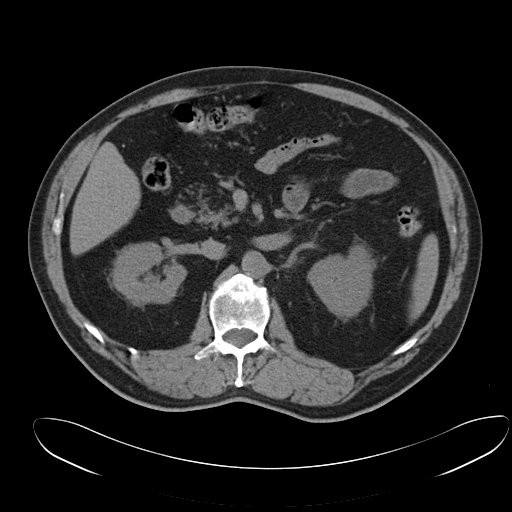
[im 66/109  bone]
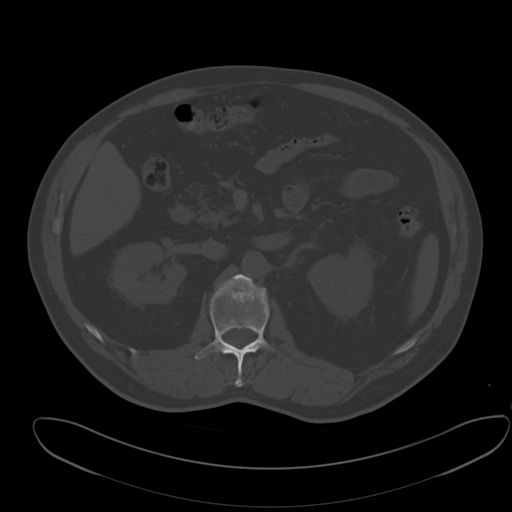
[im 71/109  soft-tissue]
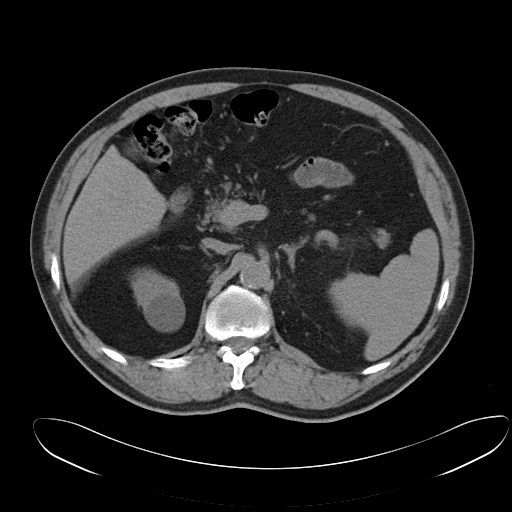
[im 80/109  soft-tissue]
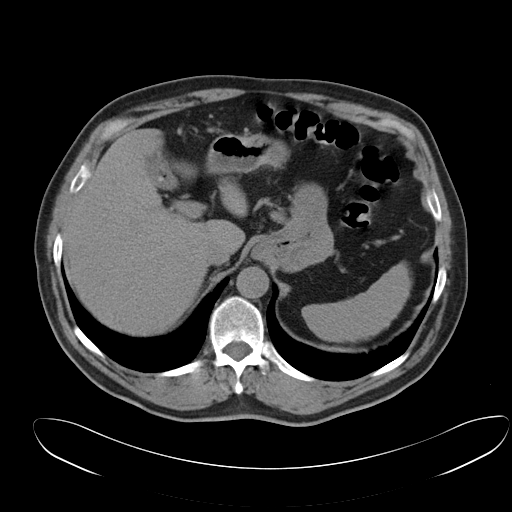
[im 90/109  soft-tissue]
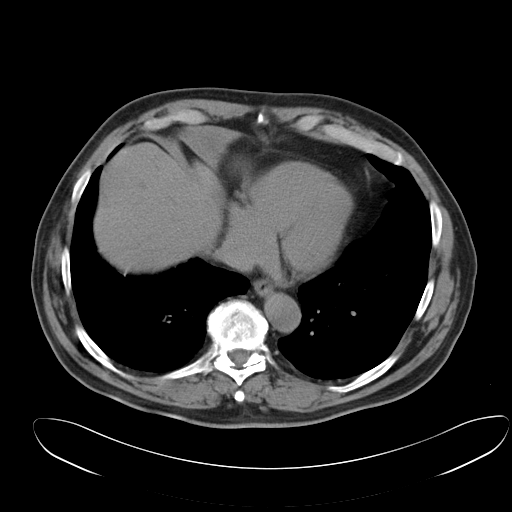
[im 94/109  soft-tissue]
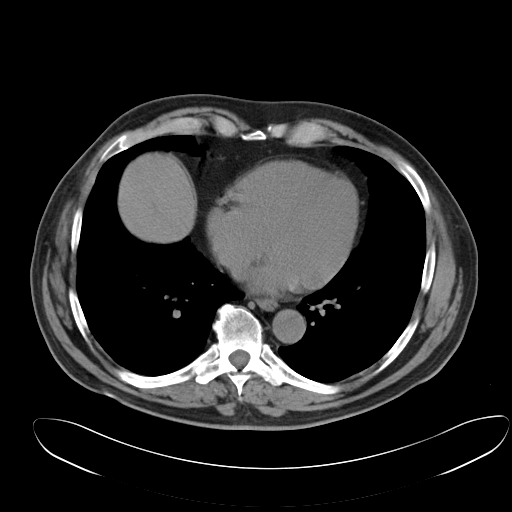
[im 104/109  soft-tissue]
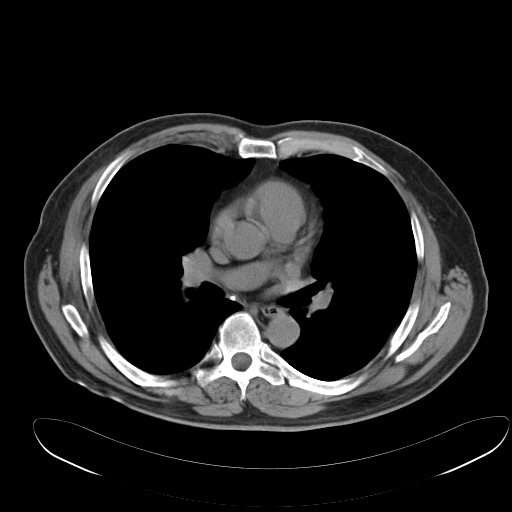

[Series 5: cor stone standard full · coronal · 1.09mm/px · 3 of 144 slices shown]
[im 48/144  soft-tissue]
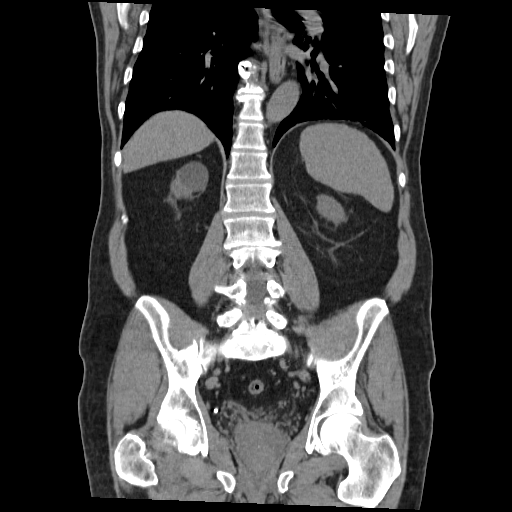
[im 64/144  soft-tissue]
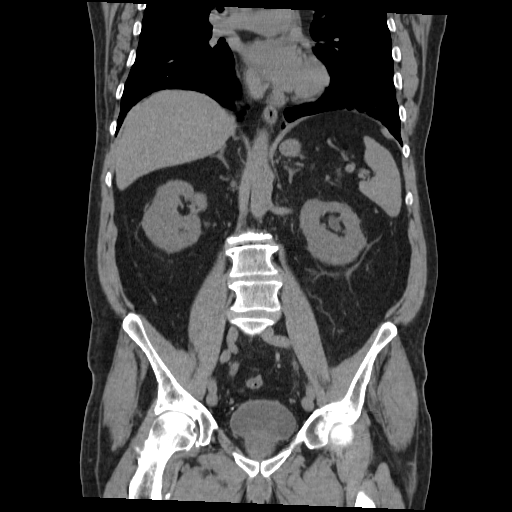
[im 80/144  soft-tissue]
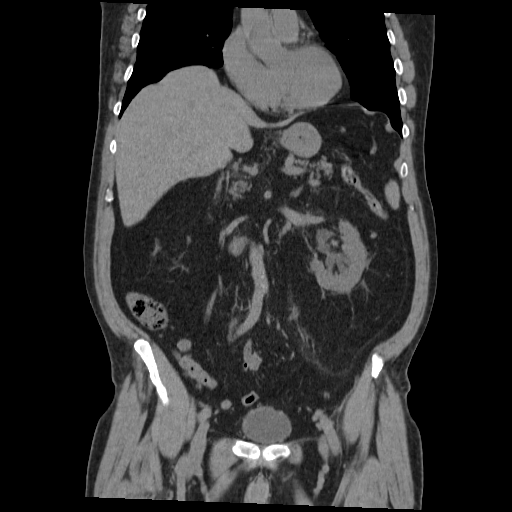

[17 of 46 positions shown; findings below may reference images not displayed]

FINDINGS: The lung bases are clear.

There is a 4 mm stone in the distal left ureter just above the
ureterovesical junction with proximal ureterectasis and
pyelocaliectasis as well as perirenal and periureteral stranding. No
additional renal stones are demonstrated. The right kidney and
ureter are decompressed. No bladder stone or bladder wall
thickening. Low-attenuation lesion in the upper pole right kidney
consistent with a cyst.

Cholelithiasis with gas-filled gallstones in the gallbladder. Fatty
infiltration of the pancreas. The unenhanced appearance of the
liver, spleen, adrenal glands, abdominal aorta, inferior vena cava,
and retroperitoneal lymph nodes is unremarkable. Stomach, small
bowel, and colon are decompressed. No free air or free fluid in the
abdomen.

Pelvis: Prostate enlargement at 4.5 x 5.4 cm. No free or loculated
pelvic fluid collections. No pelvic mass or lymphadenopathy.
Appendix is not identified. No evidence of diverticulitis.
Degenerative changes in the lumbar spine. No destructive bone
lesions are appreciated.
IMPRESSION: 4 mm stone in the distal left ureter with moderate proximal
obstruction. Additional incidental findings include cholelithiasis,
right renal cyst, and enlarged prostate gland.

## 2015-12-20 DIAGNOSIS — L820 Inflamed seborrheic keratosis: Secondary | ICD-10-CM | POA: Diagnosis not present

## 2015-12-20 DIAGNOSIS — D485 Neoplasm of uncertain behavior of skin: Secondary | ICD-10-CM | POA: Diagnosis not present

## 2015-12-20 DIAGNOSIS — L720 Epidermal cyst: Secondary | ICD-10-CM | POA: Diagnosis not present

## 2015-12-20 DIAGNOSIS — L578 Other skin changes due to chronic exposure to nonionizing radiation: Secondary | ICD-10-CM | POA: Diagnosis not present

## 2015-12-20 DIAGNOSIS — L821 Other seborrheic keratosis: Secondary | ICD-10-CM | POA: Diagnosis not present

## 2015-12-20 DIAGNOSIS — C44319 Basal cell carcinoma of skin of other parts of face: Secondary | ICD-10-CM | POA: Diagnosis not present

## 2015-12-20 DIAGNOSIS — Z85828 Personal history of other malignant neoplasm of skin: Secondary | ICD-10-CM | POA: Diagnosis not present

## 2015-12-20 DIAGNOSIS — C4491 Basal cell carcinoma of skin, unspecified: Secondary | ICD-10-CM

## 2015-12-20 HISTORY — DX: Basal cell carcinoma of skin, unspecified: C44.91

## 2016-01-11 DIAGNOSIS — M25562 Pain in left knee: Secondary | ICD-10-CM | POA: Diagnosis not present

## 2016-01-11 DIAGNOSIS — M25561 Pain in right knee: Secondary | ICD-10-CM | POA: Diagnosis not present

## 2016-03-06 DIAGNOSIS — I10 Essential (primary) hypertension: Secondary | ICD-10-CM | POA: Diagnosis not present

## 2016-03-13 DIAGNOSIS — I10 Essential (primary) hypertension: Secondary | ICD-10-CM | POA: Diagnosis not present

## 2016-04-11 DIAGNOSIS — L820 Inflamed seborrheic keratosis: Secondary | ICD-10-CM | POA: Diagnosis not present

## 2016-04-11 DIAGNOSIS — L859 Epidermal thickening, unspecified: Secondary | ICD-10-CM | POA: Diagnosis not present

## 2016-04-11 DIAGNOSIS — D229 Melanocytic nevi, unspecified: Secondary | ICD-10-CM | POA: Diagnosis not present

## 2016-04-11 DIAGNOSIS — D1800 Hemangioma unspecified site: Secondary | ICD-10-CM | POA: Diagnosis not present

## 2016-04-11 DIAGNOSIS — Z85828 Personal history of other malignant neoplasm of skin: Secondary | ICD-10-CM | POA: Diagnosis not present

## 2016-04-11 DIAGNOSIS — L578 Other skin changes due to chronic exposure to nonionizing radiation: Secondary | ICD-10-CM | POA: Diagnosis not present

## 2016-04-11 DIAGNOSIS — L821 Other seborrheic keratosis: Secondary | ICD-10-CM | POA: Diagnosis not present

## 2016-04-11 DIAGNOSIS — Z1283 Encounter for screening for malignant neoplasm of skin: Secondary | ICD-10-CM | POA: Diagnosis not present

## 2016-04-17 DIAGNOSIS — H109 Unspecified conjunctivitis: Secondary | ICD-10-CM | POA: Diagnosis not present

## 2016-09-06 DIAGNOSIS — I10 Essential (primary) hypertension: Secondary | ICD-10-CM | POA: Diagnosis not present

## 2016-09-06 DIAGNOSIS — Z0000 Encounter for general adult medical examination without abnormal findings: Secondary | ICD-10-CM | POA: Diagnosis not present

## 2016-09-14 DIAGNOSIS — I10 Essential (primary) hypertension: Secondary | ICD-10-CM | POA: Diagnosis not present

## 2016-09-14 DIAGNOSIS — Z0000 Encounter for general adult medical examination without abnormal findings: Secondary | ICD-10-CM | POA: Diagnosis not present

## 2016-10-30 DIAGNOSIS — Z1283 Encounter for screening for malignant neoplasm of skin: Secondary | ICD-10-CM | POA: Diagnosis not present

## 2016-10-30 DIAGNOSIS — L570 Actinic keratosis: Secondary | ICD-10-CM | POA: Diagnosis not present

## 2016-10-30 DIAGNOSIS — Z85828 Personal history of other malignant neoplasm of skin: Secondary | ICD-10-CM | POA: Diagnosis not present

## 2016-10-30 DIAGNOSIS — D229 Melanocytic nevi, unspecified: Secondary | ICD-10-CM | POA: Diagnosis not present

## 2016-10-30 DIAGNOSIS — D692 Other nonthrombocytopenic purpura: Secondary | ICD-10-CM | POA: Diagnosis not present

## 2016-10-30 DIAGNOSIS — L578 Other skin changes due to chronic exposure to nonionizing radiation: Secondary | ICD-10-CM | POA: Diagnosis not present

## 2016-10-30 DIAGNOSIS — L821 Other seborrheic keratosis: Secondary | ICD-10-CM | POA: Diagnosis not present

## 2017-03-01 DIAGNOSIS — I10 Essential (primary) hypertension: Secondary | ICD-10-CM | POA: Diagnosis not present

## 2017-03-12 DIAGNOSIS — E876 Hypokalemia: Secondary | ICD-10-CM | POA: Diagnosis not present

## 2017-03-12 DIAGNOSIS — I10 Essential (primary) hypertension: Secondary | ICD-10-CM | POA: Diagnosis not present

## 2017-03-22 DIAGNOSIS — I10 Essential (primary) hypertension: Secondary | ICD-10-CM | POA: Diagnosis not present

## 2017-04-05 DIAGNOSIS — E876 Hypokalemia: Secondary | ICD-10-CM | POA: Diagnosis not present

## 2017-09-18 DIAGNOSIS — I10 Essential (primary) hypertension: Secondary | ICD-10-CM | POA: Diagnosis not present

## 2017-09-18 DIAGNOSIS — Z0000 Encounter for general adult medical examination without abnormal findings: Secondary | ICD-10-CM | POA: Diagnosis not present

## 2017-09-19 DIAGNOSIS — Z136 Encounter for screening for cardiovascular disorders: Secondary | ICD-10-CM | POA: Diagnosis not present

## 2017-09-19 DIAGNOSIS — I10 Essential (primary) hypertension: Secondary | ICD-10-CM | POA: Diagnosis not present

## 2017-09-19 DIAGNOSIS — Z0000 Encounter for general adult medical examination without abnormal findings: Secondary | ICD-10-CM | POA: Diagnosis not present

## 2018-03-11 DIAGNOSIS — D485 Neoplasm of uncertain behavior of skin: Secondary | ICD-10-CM | POA: Diagnosis not present

## 2018-03-11 DIAGNOSIS — L812 Freckles: Secondary | ICD-10-CM | POA: Diagnosis not present

## 2018-03-11 DIAGNOSIS — D1800 Hemangioma unspecified site: Secondary | ICD-10-CM | POA: Diagnosis not present

## 2018-03-11 DIAGNOSIS — Z85828 Personal history of other malignant neoplasm of skin: Secondary | ICD-10-CM | POA: Diagnosis not present

## 2018-03-11 DIAGNOSIS — L820 Inflamed seborrheic keratosis: Secondary | ICD-10-CM | POA: Diagnosis not present

## 2018-03-11 DIAGNOSIS — L821 Other seborrheic keratosis: Secondary | ICD-10-CM | POA: Diagnosis not present

## 2018-03-11 DIAGNOSIS — L578 Other skin changes due to chronic exposure to nonionizing radiation: Secondary | ICD-10-CM | POA: Diagnosis not present

## 2018-03-11 DIAGNOSIS — D225 Melanocytic nevi of trunk: Secondary | ICD-10-CM | POA: Diagnosis not present

## 2018-03-26 DIAGNOSIS — I10 Essential (primary) hypertension: Secondary | ICD-10-CM | POA: Diagnosis not present

## 2018-09-12 DIAGNOSIS — I10 Essential (primary) hypertension: Secondary | ICD-10-CM | POA: Diagnosis not present

## 2018-09-12 DIAGNOSIS — Z136 Encounter for screening for cardiovascular disorders: Secondary | ICD-10-CM | POA: Diagnosis not present

## 2018-09-19 DIAGNOSIS — Z0000 Encounter for general adult medical examination without abnormal findings: Secondary | ICD-10-CM | POA: Diagnosis not present

## 2018-09-19 DIAGNOSIS — I10 Essential (primary) hypertension: Secondary | ICD-10-CM | POA: Diagnosis not present

## 2018-09-19 DIAGNOSIS — Z23 Encounter for immunization: Secondary | ICD-10-CM | POA: Diagnosis not present

## 2019-02-26 DIAGNOSIS — D485 Neoplasm of uncertain behavior of skin: Secondary | ICD-10-CM | POA: Diagnosis not present

## 2019-02-26 DIAGNOSIS — L820 Inflamed seborrheic keratosis: Secondary | ICD-10-CM | POA: Diagnosis not present

## 2019-02-26 DIAGNOSIS — L578 Other skin changes due to chronic exposure to nonionizing radiation: Secondary | ICD-10-CM | POA: Diagnosis not present

## 2019-02-26 DIAGNOSIS — L508 Other urticaria: Secondary | ICD-10-CM | POA: Diagnosis not present

## 2019-02-26 DIAGNOSIS — T07XXXA Unspecified multiple injuries, initial encounter: Secondary | ICD-10-CM | POA: Diagnosis not present

## 2019-02-26 DIAGNOSIS — C4441 Basal cell carcinoma of skin of scalp and neck: Secondary | ICD-10-CM | POA: Diagnosis not present

## 2019-03-14 DIAGNOSIS — I10 Essential (primary) hypertension: Secondary | ICD-10-CM | POA: Diagnosis not present

## 2019-03-21 DIAGNOSIS — Z6830 Body mass index (BMI) 30.0-30.9, adult: Secondary | ICD-10-CM | POA: Diagnosis not present

## 2019-03-21 DIAGNOSIS — E6609 Other obesity due to excess calories: Secondary | ICD-10-CM | POA: Diagnosis not present

## 2019-03-21 DIAGNOSIS — I10 Essential (primary) hypertension: Secondary | ICD-10-CM | POA: Diagnosis not present

## 2019-04-28 DIAGNOSIS — L578 Other skin changes due to chronic exposure to nonionizing radiation: Secondary | ICD-10-CM | POA: Diagnosis not present

## 2019-04-28 DIAGNOSIS — D1800 Hemangioma unspecified site: Secondary | ICD-10-CM | POA: Diagnosis not present

## 2019-04-28 DIAGNOSIS — L820 Inflamed seborrheic keratosis: Secondary | ICD-10-CM | POA: Diagnosis not present

## 2019-04-28 DIAGNOSIS — L821 Other seborrheic keratosis: Secondary | ICD-10-CM | POA: Diagnosis not present

## 2019-04-28 DIAGNOSIS — Z1283 Encounter for screening for malignant neoplasm of skin: Secondary | ICD-10-CM | POA: Diagnosis not present

## 2019-06-25 DIAGNOSIS — I10 Essential (primary) hypertension: Secondary | ICD-10-CM | POA: Diagnosis not present

## 2019-07-02 DIAGNOSIS — Z0000 Encounter for general adult medical examination without abnormal findings: Secondary | ICD-10-CM | POA: Diagnosis not present

## 2019-07-02 DIAGNOSIS — Z87891 Personal history of nicotine dependence: Secondary | ICD-10-CM | POA: Diagnosis not present

## 2019-07-02 DIAGNOSIS — I10 Essential (primary) hypertension: Secondary | ICD-10-CM | POA: Diagnosis not present

## 2019-07-14 DIAGNOSIS — Z85828 Personal history of other malignant neoplasm of skin: Secondary | ICD-10-CM | POA: Diagnosis not present

## 2019-07-14 DIAGNOSIS — L821 Other seborrheic keratosis: Secondary | ICD-10-CM | POA: Diagnosis not present

## 2019-07-14 DIAGNOSIS — L820 Inflamed seborrheic keratosis: Secondary | ICD-10-CM | POA: Diagnosis not present

## 2019-07-14 DIAGNOSIS — L810 Postinflammatory hyperpigmentation: Secondary | ICD-10-CM | POA: Diagnosis not present

## 2019-07-14 DIAGNOSIS — L578 Other skin changes due to chronic exposure to nonionizing radiation: Secondary | ICD-10-CM | POA: Diagnosis not present

## 2019-12-25 DIAGNOSIS — Z136 Encounter for screening for cardiovascular disorders: Secondary | ICD-10-CM | POA: Diagnosis not present

## 2019-12-25 DIAGNOSIS — I10 Essential (primary) hypertension: Secondary | ICD-10-CM | POA: Diagnosis not present

## 2020-01-01 DIAGNOSIS — Z87891 Personal history of nicotine dependence: Secondary | ICD-10-CM | POA: Diagnosis not present

## 2020-01-01 DIAGNOSIS — Z6830 Body mass index (BMI) 30.0-30.9, adult: Secondary | ICD-10-CM | POA: Diagnosis not present

## 2020-01-01 DIAGNOSIS — I10 Essential (primary) hypertension: Secondary | ICD-10-CM | POA: Diagnosis not present

## 2020-01-01 DIAGNOSIS — E669 Obesity, unspecified: Secondary | ICD-10-CM | POA: Diagnosis not present

## 2020-01-01 DIAGNOSIS — Z0000 Encounter for general adult medical examination without abnormal findings: Secondary | ICD-10-CM | POA: Diagnosis not present

## 2020-01-12 ENCOUNTER — Other Ambulatory Visit: Payer: Self-pay

## 2020-01-12 ENCOUNTER — Ambulatory Visit: Payer: Medicare HMO | Admitting: Dermatology

## 2020-01-12 DIAGNOSIS — L578 Other skin changes due to chronic exposure to nonionizing radiation: Secondary | ICD-10-CM

## 2020-01-12 DIAGNOSIS — Z1283 Encounter for screening for malignant neoplasm of skin: Secondary | ICD-10-CM | POA: Diagnosis not present

## 2020-01-12 DIAGNOSIS — Z85828 Personal history of other malignant neoplasm of skin: Secondary | ICD-10-CM

## 2020-01-12 DIAGNOSIS — L814 Other melanin hyperpigmentation: Secondary | ICD-10-CM | POA: Diagnosis not present

## 2020-01-12 DIAGNOSIS — L821 Other seborrheic keratosis: Secondary | ICD-10-CM | POA: Diagnosis not present

## 2020-01-12 DIAGNOSIS — C44519 Basal cell carcinoma of skin of other part of trunk: Secondary | ICD-10-CM | POA: Diagnosis not present

## 2020-01-12 DIAGNOSIS — D692 Other nonthrombocytopenic purpura: Secondary | ICD-10-CM

## 2020-01-12 DIAGNOSIS — D492 Neoplasm of unspecified behavior of bone, soft tissue, and skin: Secondary | ICD-10-CM

## 2020-01-12 DIAGNOSIS — C44311 Basal cell carcinoma of skin of nose: Secondary | ICD-10-CM | POA: Diagnosis not present

## 2020-01-12 NOTE — Patient Instructions (Signed)

## 2020-01-12 NOTE — Progress Notes (Signed)
Follow-Up Visit   Subjective  Caleb Maldonado is a 84 y.o. male who presents for the following: UBSE (hx BCCs R nasal tip, R nasolabial), check spots (chest), and hx of spot on nose (L nose ~11m).  It gets a scab which peels off and then recurs.   The following portions of the chart were reviewed this encounter and updated as appropriate:     Review of Systems: No other skin or systemic complaints.  Objective  Well appearing patient in no apparent distress; mood and affect are within normal limits.  All skin waist up examined.  Objective  R nasal tip, R nasolabial: Well healed scar with no evidence of recurrence.   Objective  L upper nasal dorsum: Pearly pink patch 9.89mm     Objective  Lower sternum: 8.7mm pink brown pearly patch     Assessment & Plan   Skin cancer screening performed today.  Actinic Damage - diffuse scaly erythematous macules with underlying dyspigmentation - Recommend daily broad spectrum sunscreen SPF 30+ to sun-exposed areas, reapply every 2 hours as needed.  - Call for new or changing lesions.  Seborrheic Keratoses - Stuck-on, waxy, tan-brown papules and plaques  - Discussed benign etiology and prognosis. 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 Observe for recurrence. Call clinic for new or changing lesions.  Recommend regular skin exams, daily broad-spectrum spf 30+ sunscreen use, and photoprotection.     Neoplasm of skin (2) L upper nasal dorsum  Skin / nail biopsy Type of biopsy: tangential   Informed  consent: discussed and consent obtained   Anesthesia: the lesion was anesthetized in a standard fashion   Anesthesia comment:  Area prepped with alcohol Anesthetic:  1% lidocaine w/ epinephrine 1-100,000 buffered w/ 8.4% NaHCO3 Instrument used: flexible razor blade   Hemostasis achieved with: pressure, aluminum chloride and electrodesiccation   Outcome: patient tolerated procedure well   Post-procedure details: wound care instructions given   Post-procedure details comment:  Ointment and small bandage applied  Specimen 1 - Surgical pathology Differential Diagnosis: AK R/O BCC Check Margins: No Pearly pink patch 9.52mm  Lower sternum  Skin / nail biopsy Type of biopsy: tangential   Informed consent: discussed and consent obtained   Anesthesia: the lesion was anesthetized in a standard fashion   Anesthesia comment:  Area prepped with alcohol Anesthetic:  1% lidocaine w/ epinephrine 1-100,000 buffered w/ 8.4% NaHCO3 Instrument used: flexible razor blade   Hemostasis achieved with: pressure, aluminum chloride and electrodesiccation   Outcome: patient tolerated procedure well   Post-procedure details: wound care instructions given   Post-procedure details comment:  Ointment and small bandage applied  Specimen 2 - Surgical pathology Differential Diagnosis: ISK R/O BCC Check Margins: No 8.54mm pink brown pearly patch  Discussed txt options if skin ca, L upper nasal dorsum MOHs vs EDC, and lower sternum EDC, pt prefers EDC for both.  Return in about 6 months (around 07/13/2020) for UBSE, sooner pending biopsy.   I, Caleb Maldonado, RMA, am acting as scribe for Caleb Patty, MD .

## 2020-01-13 ENCOUNTER — Telehealth: Payer: Self-pay

## 2020-01-13 NOTE — Telephone Encounter (Signed)
Advised pt of Bx results.  Discussed txt options again for the [facility] of the nose MOHs vs EDC.  Pt wants to have both BCCs txted at our office with [place].  Scheduled pt for EDC x 2./sh

## 2020-01-13 NOTE — Telephone Encounter (Signed)
-----   Message from [name], MD sent at 01/13/2020  5:48 PM EDT ----- 1. Skin , left upper nasal dorsum BASAL CELL CARCINOMA, SUPERFICIAL, NODULAR AND INFILTRATIVE PATTERNS 2. Skin , lower sternum BASAL CELL CARCINOMA, NODULAR PATTERN  At time of biopsy, Mohs vrs EDC was discussed for BCC nose.  Pt prefers EDC.  Please confirm and schedule. Recommend EDC for BCC lower sternum Need 30 min for EDC both sites

## 2020-02-06 ENCOUNTER — Ambulatory Visit: Payer: Medicare HMO | Admitting: Dermatology

## 2020-03-03 ENCOUNTER — Ambulatory Visit: Payer: Medicare HMO | Admitting: Dermatology

## 2020-04-05 ENCOUNTER — Other Ambulatory Visit: Payer: Self-pay

## 2020-04-05 ENCOUNTER — Ambulatory Visit: Payer: Medicare HMO | Admitting: Dermatology

## 2020-04-05 DIAGNOSIS — C44519 Basal cell carcinoma of skin of other part of trunk: Secondary | ICD-10-CM

## 2020-04-05 DIAGNOSIS — L578 Other skin changes due to chronic exposure to nonionizing radiation: Secondary | ICD-10-CM

## 2020-04-05 DIAGNOSIS — C44311 Basal cell carcinoma of skin of nose: Secondary | ICD-10-CM | POA: Diagnosis not present

## 2020-04-05 DIAGNOSIS — L821 Other seborrheic keratosis: Secondary | ICD-10-CM | POA: Diagnosis not present

## 2020-04-05 DIAGNOSIS — L820 Inflamed seborrheic keratosis: Secondary | ICD-10-CM

## 2020-04-05 NOTE — Patient Instructions (Addendum)
Electrodesiccation and Curettage ("Scrape and Burn") Wound Care Instructions  1. Leave the original bandage on for 24 hours if possible.  If the bandage becomes soaked or soiled before that time, it is OK to remove it and examine the wound.  A small amount of post-operative bleeding is normal.  If excessive bleeding occurs, remove the bandage, place gauze over the site and apply continuous pressure (no peeking) over the area for 30 minutes. If this does not work, please call our clinic as soon as possible or page your doctor if it is after hours.   2. Once a day, cleanse the wound with soap and water. It is fine to shower. If a thick crust develops you may use a Q-tip dipped into dilute hydrogen peroxide (mix 1:1 with water) to dissolve it.  Hydrogen peroxide can slow the healing process, so use it only as needed.    3. After washing, apply petroleum jelly (Vaseline) or an antibiotic ointment if your doctor prescribed one for you, followed by a bandage.    4. For best healing, the wound should be covered with a layer of ointment at all times. If you are not able to keep the area covered with a bandage to hold the ointment in place, this may mean re-applying the ointment several times a day.  Continue this wound care until the wound has healed and is no longer open. It may take several weeks for the wound to heal and close.  Itching and mild discomfort is normal during the healing process.  If you have any discomfort, you can take Tylenol (acetaminophen) or ibuprofen as directed on the bottle. (Please do not take these if you have an allergy to them or cannot take them for another reason).  Some redness, tenderness and white or yellow material in the wound is normal healing.  If the area becomes very sore and red, or develops a thick yellow-green material (pus), it may be infected; please notify us.    Wound healing continues for up to one year following surgery. It is not unusual to experience pain  in the scar from time to time during the interval.  If the pain becomes severe or the scar thickens, you should notify the office.    A slight amount of redness in a scar is expected for the first six months.  After six months, the redness will fade and the scar will soften and fade.  The color difference becomes less noticeable with time.  If there are any problems, return for a post-op surgery check at your earliest convenience.  To improve the appearance of the scar, you can use silicone scar gel, cream, or sheets (such as Mederma or Serica) every night for up to one year. These are available over the counter (without a prescription).  Please call our office at [phone] for any questions or concerns.  Seborrheic Keratosis  What causes seborrheic keratoses? Seborrheic keratoses are harmless, common skin growths that first appear during adult life.  As time goes by, more growths appear.  Some people may develop a large number of them.  Seborrheic keratoses appear on both covered and uncovered body parts.  They are not caused by sunlight.  The tendency to develop seborrheic keratoses can be inherited.  They vary in color from skin-colored to gray, brown, or even black.  They can be either smooth or have a rough, warty surface.   Seborrheic keratoses are superficial and look as if they were stuck on the  skin.  Under the microscope this type of keratosis looks like layers upon layers of skin.  That is why at times the top layer may seem to fall off, but the rest of the growth remains and re-grows.    Treatment Seborrheic keratoses do not need to be treated, but can easily be removed in the office.  Seborrheic keratoses often cause symptoms when they rub on clothing or jewelry.  Lesions can be in the way of shaving.  If they become inflamed, they can cause itching, soreness, or burning.  Removal of a seborrheic keratosis can be accomplished by freezing, burning, or surgery. If any spot bleeds,  scabs, or grows rapidly, please return to have it checked, as these can be an indication of a skin cancer.

## 2020-04-05 NOTE — Progress Notes (Signed)
   Follow-Up Visit   Subjective  Caleb Maldonado is a 84 y.o. male who presents for the following: Basal cell carcinomas (of the L upper nasal dorsum and the lower sternum - patient is here today for ED&C's as previously discussed).  The following portions of the chart were reviewed this encounter and updated as appropriate:     Review of Systems:  No other skin or systemic complaints except as noted in HPI or Assessment and Plan.  Objective  Well appearing patient in no apparent distress; mood and affect are within normal limits.  A focused examination was performed including the face and chest. Relevant physical exam findings are noted in the Assessment and Plan.  Objective  L upper nasal dorsum: Pink, white smooth scar- Appears clear post biopsy      Objective  lower sternum: Pink biopsy site  Objective  Lower sternum adjacent to [facility] site: Erythematous keratotic waxy stuck-on papule    Assessment & Plan  Basal cell carcinoma (BCC) of skin of nose L upper nasal dorsum  Appears clincally clear post biopsy today -  The patient will observe for any symptoms, and report promptly any worsening or unexpected persistence.  If any recurrence noted, will EDC at that time.   Basal cell carcinoma (BCC) of skin of other part of torso lower sternum  Destruction of lesion  Destruction method: electrodesiccation and curettage   Informed consent: discussed and consent obtained   Timeout:  patient name, date of birth, surgical site, and procedure verified Anesthesia: the lesion was anesthetized in a standard fashion   Anesthesia comment:  Prepped with isopropyl alcohol Anesthetic:  1% lidocaine w/ epinephrine 1-100,000 buffered w/ 8.4% NaHCO3 Curettage performed in three different directions: Yes   Electrodesiccation performed over the curetted area: Yes   Lesion length (cm):  1 Lesion width (cm):  0.9 Margin per side (cm):  0.1 Final wound size (cm):  1.2 Hemostasis  achieved with:  pressure, aluminum chloride and electrodesiccation Outcome: patient tolerated procedure well with no complications   Post-procedure details: wound care instructions given   Post-procedure details comment:  Ointment and a bandaid applied  Inflamed seborrheic keratosis Lower sternum adjacent to [facility] site  Destruction of lesion - Lower sternum adjacent to [facility] site  Destruction method: cryotherapy   Informed consent: discussed and consent obtained   Lesion destroyed using liquid nitrogen: Yes   Region frozen until ice ball extended beyond lesion: Yes   Outcome: patient tolerated procedure well with no complications   Post-procedure details: wound care instructions given    Seborrheic Keratoses - Stuck-on, waxy, tan-brown papules and plaques chest, abdomen L eyebrow - Discussed benign etiology and prognosis. - Observe - Call for any changes  Actinic Damage - diffuse scaly erythematous macules with underlying dyspigmentation - Recommend daily broad spectrum sunscreen SPF 30+ to sun-exposed areas, reapply every 2 hours as needed.  - Call for new or changing lesions.  Return for appointment as scheduled October 21, recheck nose.  Luther Redo, CMA, am acting as scribe for [name], MD .  Documentation: I have reviewed the above documentation for accuracy and completeness, and I agree with the above.  [name] MD

## 2020-06-25 DIAGNOSIS — I10 Essential (primary) hypertension: Secondary | ICD-10-CM | POA: Diagnosis not present

## 2020-07-06 DIAGNOSIS — I10 Essential (primary) hypertension: Secondary | ICD-10-CM | POA: Diagnosis not present

## 2020-07-06 DIAGNOSIS — Z136 Encounter for screening for cardiovascular disorders: Secondary | ICD-10-CM | POA: Diagnosis not present

## 2020-07-06 DIAGNOSIS — Z0000 Encounter for general adult medical examination without abnormal findings: Secondary | ICD-10-CM | POA: Diagnosis not present

## 2020-07-12 ENCOUNTER — Encounter: Payer: Medicare HMO | Admitting: Dermatology

## 2020-07-19 ENCOUNTER — Encounter: Payer: Self-pay | Admitting: Dermatology

## 2020-07-19 ENCOUNTER — Ambulatory Visit: Payer: Medicare HMO | Admitting: Dermatology

## 2020-07-19 ENCOUNTER — Other Ambulatory Visit: Payer: Self-pay

## 2020-07-19 DIAGNOSIS — L820 Inflamed seborrheic keratosis: Secondary | ICD-10-CM | POA: Diagnosis not present

## 2020-07-19 DIAGNOSIS — D1800 Hemangioma unspecified site: Secondary | ICD-10-CM

## 2020-07-19 DIAGNOSIS — L570 Actinic keratosis: Secondary | ICD-10-CM | POA: Diagnosis not present

## 2020-07-19 DIAGNOSIS — Z85828 Personal history of other malignant neoplasm of skin: Secondary | ICD-10-CM | POA: Diagnosis not present

## 2020-07-19 DIAGNOSIS — L578 Other skin changes due to chronic exposure to nonionizing radiation: Secondary | ICD-10-CM | POA: Diagnosis not present

## 2020-07-19 DIAGNOSIS — L814 Other melanin hyperpigmentation: Secondary | ICD-10-CM | POA: Diagnosis not present

## 2020-07-19 DIAGNOSIS — D229 Melanocytic nevi, unspecified: Secondary | ICD-10-CM | POA: Diagnosis not present

## 2020-07-19 DIAGNOSIS — L821 Other seborrheic keratosis: Secondary | ICD-10-CM | POA: Diagnosis not present

## 2020-07-19 DIAGNOSIS — Z1283 Encounter for screening for malignant neoplasm of skin: Secondary | ICD-10-CM

## 2020-07-19 NOTE — Patient Instructions (Signed)
Cryotherapy Aftercare  . Wash gently with soap and water everyday.   . Apply Vaseline and Band-Aid daily until healed.  

## 2020-07-19 NOTE — Progress Notes (Signed)
   Follow-Up Visit   Subjective  Caleb Maldonado is a 84 y.o. male who presents for the following: UBSE. Patient is here to follow-up on [facility] of the left upper nasal dorsum and lower sternum. Nose appeared clear with biopsy only at last visit. Lower sternum was treated with EDC. He also has a spot he would like checked on his left eyebrow that is bothersome.   The following portions of the chart were reviewed this encounter and updated as appropriate:      Review of Systems:  No other skin or systemic complaints except as noted in HPI or Assessment and Plan.  Objective  Well appearing patient in no apparent distress; mood and affect are within normal limits.  All skin waist up examined.  Objective  Left Upper Nasal Dorsum, lower sternum, see History: Well healed scar with no evidence of recurrence.   Objective  Left Nasal Tip x 1: Erythematous thin papules/macules with gritty scale.   Objective  Left Eyebrow x 1: Erythematous keratotic or waxy stuck-on papule    Assessment & Plan   Skin cancer screening performed today.   History of basal cell carcinoma (BCC) Left Upper Nasal Dorsum, lower sternum, see History  Clear. Observe for recurrence. Call clinic for new or changing lesions.  Recommend regular skin exams, daily broad-spectrum spf 30+ sunscreen use, and photoprotection.   May consider imiquimod cream for nose if any evidence of possible recurrence since treated with biopsy only.   AK (actinic keratosis) Left Nasal Tip x 1  Destruction of lesion - Left Nasal Tip x 1  Destruction method: cryotherapy   Informed consent: discussed and consent obtained   Lesion destroyed using liquid nitrogen: Yes   Region frozen until ice ball extended beyond lesion: Yes   Outcome: patient tolerated procedure well with no complications   Post-procedure details: wound care instructions given    Inflamed seborrheic keratosis Left Eyebrow x 1  Destruction of lesion - Left  Eyebrow x 1  Destruction method: cryotherapy   Informed consent: discussed and consent obtained   Lesion destroyed using liquid nitrogen: Yes   Region frozen until ice ball extended beyond lesion: Yes   Outcome: patient tolerated procedure well with no complications   Post-procedure details: wound care instructions given     Actinic Damage - diffuse scaly erythematous macules with underlying dyspigmentation - Recommend daily broad spectrum sunscreen SPF 30+ to sun-exposed areas, reapply every 2 hours as needed.  - Call for new or changing lesions.  Melanocytic Nevi - pink-flesh-colored symmetric papules on right mid back - Benign appearing on exam today - Observation - Call clinic for new or changing moles - Recommend daily use of broad spectrum spf 30+ sunscreen to sun-exposed areas.   Lentigines - Scattered tan macules - Discussed due to sun exposure - Benign, observe - Call for any changes  Seborrheic Keratoses - Stuck-on, waxy, tan-brown papules and plaques  - Discussed benign etiology and prognosis. - Observe - Call for any changes  Hemangiomas - Red papules - Discussed benign nature - Observe - Call for any changes   Return in about 6 months (around 01/17/2021) for Hx BCC, recheck nose.   IJamesetta Orleans, CMA, am acting as scribe for [name], MD .  Documentation: I have reviewed the above documentation for accuracy and completeness, and I agree with the above.  [name] MD

## 2020-12-29 DIAGNOSIS — Z136 Encounter for screening for cardiovascular disorders: Secondary | ICD-10-CM | POA: Diagnosis not present

## 2020-12-29 DIAGNOSIS — I10 Essential (primary) hypertension: Secondary | ICD-10-CM | POA: Diagnosis not present

## 2021-01-05 DIAGNOSIS — I10 Essential (primary) hypertension: Secondary | ICD-10-CM | POA: Diagnosis not present

## 2021-01-05 DIAGNOSIS — Z0000 Encounter for general adult medical examination without abnormal findings: Secondary | ICD-10-CM | POA: Diagnosis not present

## 2021-01-25 ENCOUNTER — Other Ambulatory Visit: Payer: Self-pay

## 2021-01-25 ENCOUNTER — Encounter: Payer: Self-pay | Admitting: Dermatology

## 2021-01-25 ENCOUNTER — Ambulatory Visit: Payer: Medicare HMO | Admitting: Dermatology

## 2021-01-25 DIAGNOSIS — L578 Other skin changes due to chronic exposure to nonionizing radiation: Secondary | ICD-10-CM | POA: Diagnosis not present

## 2021-01-25 DIAGNOSIS — L570 Actinic keratosis: Secondary | ICD-10-CM

## 2021-01-25 DIAGNOSIS — D229 Melanocytic nevi, unspecified: Secondary | ICD-10-CM

## 2021-01-25 DIAGNOSIS — L814 Other melanin hyperpigmentation: Secondary | ICD-10-CM | POA: Diagnosis not present

## 2021-01-25 DIAGNOSIS — D692 Other nonthrombocytopenic purpura: Secondary | ICD-10-CM | POA: Diagnosis not present

## 2021-01-25 DIAGNOSIS — L821 Other seborrheic keratosis: Secondary | ICD-10-CM | POA: Diagnosis not present

## 2021-01-25 DIAGNOSIS — Z85828 Personal history of other malignant neoplasm of skin: Secondary | ICD-10-CM | POA: Diagnosis not present

## 2021-01-25 NOTE — Progress Notes (Signed)
Follow-Up Visit   Subjective  Caleb Maldonado is a 85 y.o. male who presents for the following: Follow-up (Patient here for 6 month follow-up. He has a history of BCCs, most recently left upper nasal dorsum and lower sternum. No spots of concern today.).  L upper nasal dorsum, clear with bx only 4/21.   The following portions of the chart were reviewed this encounter and updated as appropriate:       Review of Systems:  No other skin or systemic complaints except as noted in HPI or Assessment and Plan.  Objective  Well appearing patient in no apparent distress; mood and affect are within normal limits.  A focused examination was performed including face, scalp, back, chest, arms. Relevant physical exam findings are noted in the Assessment and Plan.  Objective  Left upper nasal dorsum at inf edge of scar x 2, R nasal dorsum above alar crease x 1 (3): Indistinct Pink/flesh smooth macule- inf/med to [place] site, tiny flesh papule lateral to BCC site  Pink scaly macule  Objective  L upper nasal dorsum, lower sternum, see history for other locations: Well healed scar with no evidence of recurrence.   Objective  R forearm x 1, R hand dorsum x 1, L lower elbow x 1, L forearm x 2 (5): Keratotic papules.   Assessment & Plan   Actinic Damage - chronic, secondary to cumulative UV radiation exposure/sun exposure over time - diffuse scaly erythematous macules with underlying dyspigmentation - Recommend daily broad spectrum sunscreen SPF 30+ to sun-exposed areas, reapply every 2 hours as needed.  - Recommend staying in the shade or wearing long sleeves, sun glasses (UVA+UVB protection) and wide brim hats (4-inch brim around the entire circumference of the hat). - Call for new or changing lesions. Seborrheic Keratoses - Stuck-on, waxy, tan-brown papules and/or plaques  - Benign-appearing - Discussed benign etiology and prognosis. - Observe - Call for any changes  Lentigines - Scattered  tan macules - Due to sun exposure - Benign-appering, observe - Recommend daily broad spectrum sunscreen SPF 30+ to sun-exposed areas, reapply every 2 hours as needed. - Call for any changes  Purpura - Chronic; persistent and recurrent.  Treatable, but not curable. - Violaceous macules and patches - Benign - Related to trauma, age, sun damage and/or use of blood thinners, chronic use of topical and/or oral steroids - Observe - Can use OTC arnica containing moisturizer such as Dermend Bruise Formula if desired - Call for worsening or other concerns  AK (actinic keratosis) (3) Left upper nasal dorsum at inf edge of scar x 2, R nasal dorsum above alar crease x 1  Recheck L upper nasal dorsum on f/u.  Actinic keratoses are precancerous spots that appear secondary to cumulative UV radiation exposure/sun exposure over time. They are chronic with expected duration over 1 year. A portion of actinic keratoses will progress to squamous cell carcinoma of the skin. It is not possible to reliably predict which spots will progress to skin cancer and so treatment is recommended to prevent development of skin cancer.  Recommend daily broad spectrum sunscreen SPF 30+ to sun-exposed areas, reapply every 2 hours as needed.  Recommend staying in the shade or wearing long sleeves, sun glasses (UVA+UVB protection) and wide brim hats (4-inch brim around the entire circumference of the hat). Call for new or changing lesions.   Prior to procedure, discussed risks of blister formation, small wound, skin dyspigmentation, or rare scar following cryotherapy.    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 Observe for recurrence. Call clinic for new or changing lesions.  Recommend regular skin exams, daily broad-spectrum spf 30+ sunscreen use, and photoprotection.     Hypertrophic actinic keratosis (5) R forearm x 1, R hand dorsum x 1, L lower elbow x 1, L forearm x 2  vs ISKs  Prior to procedure, discussed risks of blister formation, small wound, skin dyspigmentation, or rare scar following cryotherapy.   Recheck left lower elbow on f/u.  Destruction of lesion - R forearm x 1, R hand dorsum x 1, L lower elbow x 1, L forearm x 2  Destruction method: cryotherapy   Destruction method comment:  Electrodessication Informed consent: discussed and consent obtained   Lesion destroyed using liquid nitrogen: Yes   Region frozen until ice ball extended beyond lesion: Yes   Outcome: patient tolerated procedure well with no complications   Post-procedure details: wound care instructions given     Melanocytic Nevi - Tan-brown and/or pink-flesh-colored symmetric macules and papules - Benign appearing on exam today - Observation - Call clinic for new or changing moles - Recommend daily use of broad spectrum spf 30+ sunscreen to sun-exposed areas.    Return in about 3 months (around 04/26/2021) for recheck nose, elbow.   IJamesetta Orleans, CMA, am acting as scribe for [name], MD .  Documentation: I have reviewed the above documentation for accuracy and completeness, and I agree with the above.  [name] MD

## 2021-01-25 NOTE — Patient Instructions (Addendum)
Cryotherapy Aftercare  . Wash gently with soap and water everyday.   . Apply Vaseline and Band-Aid daily until healed.  Recommend daily broad spectrum sunscreen SPF 30+ to sun-exposed areas, reapply every 2 hours as needed. Call for new or changing lesions.  Staying in the shade or wearing long sleeves, sun glasses (UVA+UVB protection) and wide brim hats (4-inch brim around the entire circumference of the hat) are also recommended for sun protection.   If you have any questions or concerns for your doctor, please call our main line at [phone] and press option 4 to reach your doctor's medical assistant. If no one answers, please leave a voicemail as directed and we will return your call as soon as possible. Messages left after 4 pm will be answered the following business day.   You may also send us a message via MyChart. We typically respond to MyChart messages within 1-2 business days.  For prescription refills, please ask your pharmacy to contact our office. Our fax number is [phone].  If you have an urgent issue when the clinic is closed that cannot wait until the next business day, you can page your doctor at the number below.    Please note that while we do our best to be available for urgent issues outside of office hours, we are not available 24/7.   If you have an urgent issue and are unable to reach us, you may choose to seek medical care at your doctor's office, retail clinic, urgent care center, or emergency room.  If you have a medical emergency, please immediately call 911 or go to the emergency department.  Pager Numbers  - Dr. Kowalski: [phone]  - Dr. Moye: [phone]  - Dr. Stewart: [phone]  In the event of inclement weather, please call our main line at [phone] for an update on the status of any delays or closures.  Dermatology Medication Tips: Please keep the boxes that topical medications come in in order to help keep track of the  instructions about where and how to use these. Pharmacies typically print the medication instructions only on the boxes and not directly on the medication tubes.   If your medication is too expensive, please contact our office at [phone] option 4 or send us a message through MyChart.   We are unable to tell what your co-pay for medications will be in advance as this is different depending on your insurance coverage. However, we may be able to find a substitute medication at lower cost or fill out paperwork to get insurance to cover a needed medication.   If a prior authorization is required to get your medication covered by your insurance company, please allow us 1-2 business days to complete this process.  Drug prices often vary depending on where the prescription is filled and some pharmacies may offer cheaper prices.  The website www.goodrx.com contains coupons for medications through different pharmacies. The prices here do not account for what the cost may be with help from insurance (it may be cheaper with your insurance), but the website can give you the price if you did not use any insurance.  - You can print the associated coupon and take it with your prescription to the pharmacy.  - You may also stop by our office during regular business hours and pick up a GoodRx coupon card.  - If you need your prescription sent electronically to a different pharmacy, notify our office through [facility] MyChart or by phone at [phone]   at [phone] option 4.  

## 2021-05-02 ENCOUNTER — Other Ambulatory Visit: Payer: Self-pay

## 2021-05-02 ENCOUNTER — Encounter: Payer: Self-pay | Admitting: Dermatology

## 2021-05-02 ENCOUNTER — Ambulatory Visit: Payer: Medicare HMO | Admitting: Dermatology

## 2021-05-02 DIAGNOSIS — C44311 Basal cell carcinoma of skin of nose: Secondary | ICD-10-CM | POA: Diagnosis not present

## 2021-05-02 DIAGNOSIS — D492 Neoplasm of unspecified behavior of bone, soft tissue, and skin: Secondary | ICD-10-CM

## 2021-05-02 DIAGNOSIS — L814 Other melanin hyperpigmentation: Secondary | ICD-10-CM

## 2021-05-02 DIAGNOSIS — L578 Other skin changes due to chronic exposure to nonionizing radiation: Secondary | ICD-10-CM

## 2021-05-02 DIAGNOSIS — L821 Other seborrheic keratosis: Secondary | ICD-10-CM

## 2021-05-02 NOTE — Patient Instructions (Addendum)
If you have any questions or concerns for your doctor, please call our main line at [phone] and press option 4 to reach your doctor's medical assistant. If no one answers, please leave a voicemail as directed and we will return your call as soon as possible. Messages left after 4 pm will be answered the following business day.   You may also send us a message via MyChart. We typically respond to MyChart messages within 1-2 business days.  For prescription refills, please ask your pharmacy to contact our office. Our fax number is [phone].  If you have an urgent issue when the clinic is closed that cannot wait until the next business day, you can page your doctor at the number below.    Please note that while we do our best to be available for urgent issues outside of office hours, we are not available 24/7.   If you have an urgent issue and are unable to reach us, you may choose to seek medical care at your doctor's office, retail clinic, urgent care center, or emergency room.  If you have a medical emergency, please immediately call 911 or go to the emergency department.  Pager Numbers  - Dr. Kowalski: [phone]  - Dr. Moye: [phone]  - Dr. Stewart: [phone]  In the event of inclement weather, please call our main line at [phone] for an update on the status of any delays or closures.  Dermatology Medication Tips: Please keep the boxes that topical medications come in in order to help keep track of the instructions about where and how to use these. Pharmacies typically print the medication instructions only on the boxes and not directly on the medication tubes.   If your medication is too expensive, please contact our office at [phone] option 4 or send us a message through MyChart.   We are unable to tell what your co-pay for medications will be in advance as this is different depending on your insurance coverage. However, we may be able to find a substitute  medication at lower cost or fill out paperwork to get insurance to cover a needed medication.   If a prior authorization is required to get your medication covered by your insurance company, please allow us 1-2 business days to complete this process.  Drug prices often vary depending on where the prescription is filled and some pharmacies may offer cheaper prices.  The website www.goodrx.com contains coupons for medications through different pharmacies. The prices here do not account for what the cost may be with help from insurance (it may be cheaper with your insurance), but the website can give you the price if you did not use any insurance.  - You can print the associated coupon and take it with your prescription to the pharmacy.  - You may also stop by our office during regular business hours and pick up a GoodRx coupon card.  - If you need your prescription sent electronically to a different pharmacy, notify our office through [facility] MyChart or by phone at [phone] option 4.   Wound Care Instructions  Cleanse wound gently with soap and water once a day then pat dry with clean gauze. Apply a thing coat of Petrolatum (petroleum jelly, "Vaseline") over the wound (unless you have an allergy to this). We recommend that you use a new, sterile tube of Vaseline. Do not pick or remove scabs. Do not remove the yellow or white "healing tissue" from the base of the wound.  Cover the   wound with fresh, clean, nonstick gauze and secure with paper tape. You may use Band-Aids in place of gauze and tape if the would is small enough, but would recommend trimming much of the tape off as there is often too much. Sometimes Band-Aids can irritate the skin.  You should call the office for your biopsy report after 1 week if you have not already been contacted.  If you experience any problems, such as abnormal amounts of bleeding, swelling, significant bruising, significant pain, or evidence of infection,  please call the office immediately.  FOR ADULT SURGERY PATIENTS: If you need something for pain relief you may take 1 extra strength Tylenol (acetaminophen) AND 2 Ibuprofen (200mg each) together every 4 hours as needed for pain. (do not take these if you are allergic to them or if you have a reason you should not take them.) Typically, you may only need pain medication for 1 to 3 days.    

## 2021-05-02 NOTE — Progress Notes (Signed)
   Follow-Up Visit   Subjective  Caleb Maldonado is a 85 y.o. male who presents for the following: Actinic Keratosis (Face, recheck L upper nasal dorsum), Hypertrophic AK vs ISK (Arms, hands, recheck L lower elbow), and Nevus (Chest, yrs).   The following portions of the chart were reviewed this encounter and updated as appropriate:       Review of Systems:  No other skin or systemic complaints except as noted in HPI or Assessment and Plan.  Objective  Well appearing patient in no apparent distress; mood and affect are within normal limits.  A focused examination was performed including face, arms, hands, trunk. Relevant physical exam findings are noted in the Assessment and Plan.  L upper nasal dorsum inferior to scar 6.0 x 3.39m pink pearly pap        Assessment & Plan   Seborrheic Keratoses - Stuck-on, waxy, tan-brown papules and/or plaques  - Benign-appearing - Discussed benign etiology and prognosis. - Observe - Call for any changes  Actinic Damage - chronic, secondary to cumulative UV radiation exposure/sun exposure over time - diffuse scaly erythematous macules with underlying dyspigmentation - Recommend daily broad spectrum sunscreen SPF 30+ to sun-exposed areas, reapply every 2 hours as needed.  - Recommend staying in the shade or wearing long sleeves, sun glasses (UVA+UVB protection) and wide brim hats (4-inch brim around the entire circumference of the hat). - Call for new or changing lesions.  Lentigines - Scattered tan macules - Due to sun exposure - Benign-appering, observe - Recommend daily broad spectrum sunscreen SPF 30+ to sun-exposed areas, reapply every 2 hours as needed. - Call for any changes  Neoplasm of skin L upper nasal dorsum inferior to scar  Skin / nail biopsy Type of biopsy: tangential   Informed consent: discussed and consent obtained   Anesthesia: the lesion was anesthetized in a standard fashion   Anesthesia comment:  Area  prepped with alcohol Anesthetic:  1% lidocaine w/ epinephrine 1-100,000 buffered w/ 8.4% NaHCO3 Instrument used: flexible razor blade   Hemostasis achieved with: pressure, aluminum chloride and electrodesiccation   Outcome: patient tolerated procedure well    Destruction of lesion  Destruction method: electrodesiccation and curettage   Informed consent: discussed and consent obtained   Timeout:  patient name, date of birth, surgical site, and procedure verified Curettage performed in three different directions: Yes   Electrodesiccation performed over the curetted area: Yes   Lesion length (cm):  0.6 Lesion width (cm):  0.3 Margin per side (cm):  0.3 Final wound size (cm):  1.2 Hemostasis achieved with:  pressure, aluminum chloride and electrodesiccation Outcome: patient tolerated procedure well with no complications   Post-procedure details: wound care instructions given   Additional details:  Final treatment defect 1.2 x 0.5cm  Specimen 1 - Surgical pathology Differential Diagnosis: D48.5 Recurrent BCC vs other  Check Margins: No 6.0 x 3.010mpink pearly pap EDC today  Return in about 3 months (around 08/02/2021) for UBSE, Hx of BCC, Hx of AKs.  I, SoOthelia PullingRMA, am acting as scribe for TaBrendolyn PattyMD .  Documentation: I have reviewed the above documentation for accuracy and completeness, and I agree with the above.  TaBrendolyn PattyD

## 2021-05-04 ENCOUNTER — Telehealth: Payer: Self-pay

## 2021-05-04 NOTE — Telephone Encounter (Signed)
-----   Message from [name], MD sent at 05/04/2021  9:29 AM EDT ----- Skin , left upper nasal dorsum inferior to scar BASAL CELL CARCINOMA, NODULAR PATTERN  BCC skin cancer- already treated with EDC at time of biopsy Will observe for recurrence.  If recurs will need Mohs - please call patient

## 2021-05-04 NOTE — Telephone Encounter (Signed)
Advised pt and wife of bx results.  Advised if the [facility] recurred Dr. Nicole Kindred would recommend [facility].  Advised pt to keep f/u appt./sh

## 2021-05-04 NOTE — Telephone Encounter (Signed)
Tried to contact patient with bx results. No answer and no voicemail.

## 2021-06-30 DIAGNOSIS — I10 Essential (primary) hypertension: Secondary | ICD-10-CM | POA: Diagnosis not present

## 2021-07-07 DIAGNOSIS — Z136 Encounter for screening for cardiovascular disorders: Secondary | ICD-10-CM | POA: Diagnosis not present

## 2021-07-07 DIAGNOSIS — I10 Essential (primary) hypertension: Secondary | ICD-10-CM | POA: Diagnosis not present

## 2021-07-07 DIAGNOSIS — Z1389 Encounter for screening for other disorder: Secondary | ICD-10-CM | POA: Diagnosis not present

## 2021-07-07 DIAGNOSIS — Z0000 Encounter for general adult medical examination without abnormal findings: Secondary | ICD-10-CM | POA: Diagnosis not present

## 2021-07-11 DIAGNOSIS — Z23 Encounter for immunization: Secondary | ICD-10-CM | POA: Diagnosis not present

## 2021-08-02 ENCOUNTER — Ambulatory Visit: Payer: Medicare HMO | Admitting: Dermatology

## 2021-08-02 ENCOUNTER — Encounter: Payer: Self-pay | Admitting: Dermatology

## 2021-08-02 ENCOUNTER — Other Ambulatory Visit: Payer: Self-pay

## 2021-08-02 DIAGNOSIS — L578 Other skin changes due to chronic exposure to nonionizing radiation: Secondary | ICD-10-CM | POA: Diagnosis not present

## 2021-08-02 DIAGNOSIS — L918 Other hypertrophic disorders of the skin: Secondary | ICD-10-CM | POA: Diagnosis not present

## 2021-08-02 DIAGNOSIS — D485 Neoplasm of uncertain behavior of skin: Secondary | ICD-10-CM

## 2021-08-02 DIAGNOSIS — Z1283 Encounter for screening for malignant neoplasm of skin: Secondary | ICD-10-CM | POA: Diagnosis not present

## 2021-08-02 DIAGNOSIS — L821 Other seborrheic keratosis: Secondary | ICD-10-CM | POA: Diagnosis not present

## 2021-08-02 DIAGNOSIS — C44319 Basal cell carcinoma of skin of other parts of face: Secondary | ICD-10-CM | POA: Diagnosis not present

## 2021-08-02 DIAGNOSIS — Z85828 Personal history of other malignant neoplasm of skin: Secondary | ICD-10-CM

## 2021-08-02 DIAGNOSIS — I781 Nevus, non-neoplastic: Secondary | ICD-10-CM | POA: Diagnosis not present

## 2021-08-02 DIAGNOSIS — L814 Other melanin hyperpigmentation: Secondary | ICD-10-CM | POA: Diagnosis not present

## 2021-08-02 DIAGNOSIS — L820 Inflamed seborrheic keratosis: Secondary | ICD-10-CM

## 2021-08-02 DIAGNOSIS — C4491 Basal cell carcinoma of skin, unspecified: Secondary | ICD-10-CM

## 2021-08-02 DIAGNOSIS — C44310 Basal cell carcinoma of skin of unspecified parts of face: Secondary | ICD-10-CM

## 2021-08-02 HISTORY — DX: Basal cell carcinoma of skin, unspecified: C44.91

## 2021-08-02 NOTE — Patient Instructions (Addendum)
Wound Care Instructions  Cleanse wound gently with soap and water once a day then pat dry with clean gauze. Apply a thing coat of Petrolatum (petroleum jelly, "Vaseline") over the wound (unless you have an allergy to this). We recommend that you use a new, sterile tube of Vaseline. Do not pick or remove scabs. Do not remove the yellow or white "healing tissue" from the base of the wound.  Cover the wound with fresh, clean, nonstick gauze and secure with paper tape. You may use Band-Aids in place of gauze and tape if the would is small enough, but would recommend trimming much of the tape off as there is often too much. Sometimes Band-Aids can irritate the skin.  You should call the office for your biopsy report after 1 week if you have not already been contacted.  If you experience any problems, such as abnormal amounts of bleeding, swelling, significant bruising, significant pain, or evidence of infection, please call the office immediately.  FOR ADULT SURGERY PATIENTS: If you need something for pain relief you may take 1 extra strength Tylenol (acetaminophen) AND 2 Ibuprofen (200mg each) together every 4 hours as needed for pain. (do not take these if you are allergic to them or if you have a reason you should not take them.) Typically, you may only need pain medication for 1 to 3 days.   If you have any questions or concerns for your doctor, please call our main line at [phone] and press option 4 to reach your doctor's medical assistant. If no one answers, please leave a voicemail as directed and we will return your call as soon as possible. Messages left after 4 pm will be answered the following business day.   You may also send us a message via MyChart. We typically respond to MyChart messages within 1-2 business days.  For prescription refills, please ask your pharmacy to contact our office. Our fax number is [phone].  If you have an urgent issue when the clinic is closed that  cannot wait until the next business day, you can page your doctor at the number below.    Please note that while we do our best to be available for urgent issues outside of office hours, we are not available 24/7.   If you have an urgent issue and are unable to reach us, you may choose to seek medical care at your doctor's office, retail clinic, urgent care center, or emergency room.  If you have a medical emergency, please immediately call 911 or go to the emergency department.  Pager Numbers  - Dr. Kowalski: [phone]  - Dr. Moye: [phone]  - Dr. Stewart: [phone]  In the event of inclement weather, please call our main line at [phone] for an update on the status of any delays or closures.  Dermatology Medication Tips: Please keep the boxes that topical medications come in in order to help keep track of the instructions about where and how to use these. Pharmacies typically print the medication instructions only on the boxes and not directly on the medication tubes.   If your medication is too expensive, please contact our office at [phone] option 4 or send us a message through MyChart.   We are unable to tell what your co-pay for medications will be in advance as this is different depending on your insurance coverage. However, we may be able to find a substitute medication at lower cost or fill out paperwork to get insurance to cover a needed   medication.   If a prior authorization is required to get your medication covered by your insurance company, please allow us 1-2 business days to complete this process.  Drug prices often vary depending on where the prescription is filled and some pharmacies may offer cheaper prices.  The website www.goodrx.com contains coupons for medications through different pharmacies. The prices here do not account for what the cost may be with help from insurance (it may be cheaper with your insurance), but the website can give you the  price if you did not use any insurance.  - You can print the associated coupon and take it with your prescription to the pharmacy.  - You may also stop by our office during regular business hours and pick up a GoodRx coupon card.  - If you need your prescription sent electronically to a different pharmacy, notify our office through [facility] MyChart or by phone at [phone] option 4.   

## 2021-08-02 NOTE — Progress Notes (Signed)
Follow-Up Visit   Subjective  Caleb Maldonado is a 85 y.o. male who presents for the following: Follow-up.  Patient here for 3 month follow up and UBSE. He has a history of BCCs. No spots of concern today. Spots under eye previously frozen have come back.  The following portions of the chart were reviewed this encounter and updated as appropriate:       Review of Systems:  No other skin or systemic complaints except as noted in HPI or Assessment and Plan.  Objective  Well appearing patient in no apparent distress; mood and affect are within normal limits.  All skin waist up examined.  L upper nasal dorsum, lower sternum, L post auricular neck, R nasal tip, R nasolabial Well healed scar with no evidence of recurrence.   Right glabella 5.0 mm pink pearly papule       Right Malar Cheek x 1, R Infraocular x 1 (2) Erythematous keratotic or waxy stuck-on papule or plaque.    Assessment & Plan  Skin cancer screening performed today.  Actinic Damage - chronic, secondary to cumulative UV radiation exposure/sun exposure over time - diffuse scaly erythematous macules with underlying dyspigmentation - Recommend daily broad spectrum sunscreen SPF 30+ to sun-exposed areas, reapply every 2 hours as needed.  - Recommend staying in the shade or wearing long sleeves, sun glasses (UVA+UVB protection) and wide brim hats (4-inch brim around the entire circumference of the hat). - Call for new or changing lesions.  Seborrheic Keratoses - Stuck-on, waxy, tan-brown papules and/or plaques, including left ear concha  - Benign-appearing - Discussed benign etiology and prognosis. - Observe - Call for any changes  Acrochordons (Skin Tags) - Fleshy, skin-colored pedunculated papules of the back - Benign appearing.  - Observe. - If desired, they can be removed with an in office procedure that is not covered by insurance. - Please call the clinic if you notice any new or changing  lesions.  Lentigines - Scattered tan macules - Due to sun exposure - Benign-appering, observe - Recommend daily broad spectrum sunscreen SPF 30+ to sun-exposed areas, reapply every 2 hours as needed. - Call for any changes  Telangiectasia - Dilated blood vessel - Benign appearing on exam - Call for changes   History of basal cell carcinoma (BCC) L upper nasal dorsum, lower sternum, L post auricular neck, R nasal tip, R nasolabial  Clear. Observe for recurrence. Call clinic for new or changing lesions.  Recommend regular skin exams, daily broad-spectrum spf 30+ sunscreen use, and photoprotection.    Neoplasm of uncertain behavior of skin Right glabella  Skin / nail biopsy Type of biopsy: tangential   Informed consent: discussed and consent obtained   Patient was prepped and draped in usual sterile fashion: Area prepped with alcohol. Anesthesia: the lesion was anesthetized in a standard fashion   Anesthetic:  1% lidocaine w/ epinephrine 1-100,000 buffered w/ 8.4% NaHCO3 Instrument used: flexible razor blade   Hemostasis achieved with: pressure, aluminum chloride and electrodesiccation   Outcome: patient tolerated procedure well    Destruction of lesion  Destruction method: electrodesiccation and curettage   Informed consent: discussed and consent obtained   Timeout:  patient name, date of birth, surgical site, and procedure verified Curettage performed in three different directions: Yes   Electrodesiccation performed over the curetted area: Yes   Lesion length (cm):  0.5 Final wound size (cm):  0.9 Hemostasis achieved with:  pressure, aluminum chloride and electrodesiccation Outcome: patient tolerated procedure well with  no complications   Post-procedure details: wound care instructions given   Post-procedure details comment:  Ointment and bandage applied.  Specimen 1 - Surgical pathology Differential Diagnosis: BCC vs other Check Margins: No 5.0 mm pink pearly  papule EDC today  Inflamed seborrheic keratosis Right Malar Cheek x 1, R Infraocular x 1  Residual  Destruction of lesion - Right Malar Cheek x 1, R Infraocular x 1  Destruction method: cryotherapy   Informed consent: discussed and consent obtained   Lesion destroyed using liquid nitrogen: Yes   Region frozen until ice ball extended beyond lesion: Yes   Outcome: patient tolerated procedure well with no complications   Post-procedure details: wound care instructions given   Additional details:  Prior to procedure, discussed risks of blister formation, small wound, skin dyspigmentation, or rare scar following cryotherapy. Recommend Vaseline ointment to treated areas while healing.   Return in about 6 months (around 01/30/2022) for f/u bx, AKs, h/o BCCs.  IJamesetta Orleans, CMA, am acting as scribe for [name], MD .  Documentation: I have reviewed the above documentation for accuracy and completeness, and I agree with the above.  [name] MD

## 2021-08-04 ENCOUNTER — Telehealth: Payer: Self-pay

## 2021-08-04 NOTE — Telephone Encounter (Signed)
-----   Message from [name], MD sent at 08/04/2021  3:45 PM EDT ----- Skin , right glabella BASAL CELL CARCINOMA, NODULAR PATTERN, ULCERATED  BCC skin cancer- already treated with EDC at time of biopsy    - please call patient

## 2021-08-04 NOTE — Telephone Encounter (Signed)
Advised patient's wife of bx results/sh

## 2022-01-04 DIAGNOSIS — I10 Essential (primary) hypertension: Secondary | ICD-10-CM | POA: Diagnosis not present

## 2022-01-04 DIAGNOSIS — Z136 Encounter for screening for cardiovascular disorders: Secondary | ICD-10-CM | POA: Diagnosis not present

## 2022-01-11 DIAGNOSIS — E876 Hypokalemia: Secondary | ICD-10-CM | POA: Diagnosis not present

## 2022-01-11 DIAGNOSIS — Z0000 Encounter for general adult medical examination without abnormal findings: Secondary | ICD-10-CM | POA: Diagnosis not present

## 2022-01-11 DIAGNOSIS — R030 Elevated blood-pressure reading, without diagnosis of hypertension: Secondary | ICD-10-CM | POA: Diagnosis not present

## 2022-01-30 ENCOUNTER — Ambulatory Visit: Payer: Medicare HMO | Admitting: Dermatology

## 2022-01-30 DIAGNOSIS — L821 Other seborrheic keratosis: Secondary | ICD-10-CM | POA: Diagnosis not present

## 2022-01-30 DIAGNOSIS — L578 Other skin changes due to chronic exposure to nonionizing radiation: Secondary | ICD-10-CM

## 2022-01-30 DIAGNOSIS — Z85828 Personal history of other malignant neoplasm of skin: Secondary | ICD-10-CM | POA: Diagnosis not present

## 2022-01-30 DIAGNOSIS — L820 Inflamed seborrheic keratosis: Secondary | ICD-10-CM | POA: Diagnosis not present

## 2022-01-30 NOTE — Patient Instructions (Addendum)
Cryotherapy Aftercare ? ?Wash gently with soap and water everyday.   ?Apply Vaseline and Band-Aid daily until healed.  ? ? ?Recommend daily broad spectrum sunscreen SPF 30+ to sun-exposed areas, reapply every 2 hours as needed. Call for new or changing lesions.  ?Staying in the shade or wearing long sleeves, sun glasses (UVA+UVB protection) and wide brim hats (4-inch brim around the entire circumference of the hat) are also recommended for sun protection.  ? ? ?If You Need Anything After Your Visit ? ?If you have any questions or concerns for your doctor, please call our main line at [phone] and press option 4 to reach your doctor's medical assistant. If no one answers, please leave a voicemail as directed and we will return your call as soon as possible. Messages left after 4 pm will be answered the following business day.  ? ?You may also send [place] a message via MyChart. We typically respond to MyChart messages within 1-2 business days. ? ?For prescription refills, please ask your pharmacy to contact our office. Our fax number is [phone]. ? ?If you have an urgent issue when the clinic is closed that cannot wait until the next business day, you can page your doctor at the number below.   ? ?Please note that while we do our best to be available for urgent issues outside of office hours, we are not available 24/7.  ? ?If you have an urgent issue and are unable to reach [place], you may choose to seek medical care at your doctor's office, retail clinic, urgent care center, or emergency room. ? ?If you have a medical emergency, please immediately call 911 or go to the emergency department. ? ?Pager Numbers ? ?- Dr. Nehemiah Massed: [phone] ? ?- Dr. Laurence Ferrari: [phone] ? ?- Dr. Nicole Kindred: [phone] ? ?In the event of inclement weather, please call our main line at [phone] for an update on the status of any delays or closures. ? ?Dermatology Medication Tips: ?Please keep the boxes that topical medications come in  in order to help keep track of the instructions about where and how to use these. Pharmacies typically print the medication instructions only on the boxes and not directly on the medication tubes.  ? ?If your medication is too expensive, please contact our office at [phone] option 4 or send [place] a message through [place].  ? ?We are unable to tell what your co-pay for medications will be in advance as this is different depending on your insurance coverage. However, we may be able to find a substitute medication at lower cost or fill out paperwork to get insurance to cover a needed medication.  ? ?If a prior authorization is required to get your medication covered by your insurance company, please allow [place] 1-2 business days to complete this process. ? ?Drug prices often vary depending on where the prescription is filled and some pharmacies may offer cheaper prices. ? ?The website www.goodrx.com contains coupons for medications through different pharmacies. The prices here do not account for what the cost may be with help from insurance (it may be cheaper with your insurance), but the website can give you the price if you did not use any insurance.  ?- You can print the associated coupon and take it with your prescription to the pharmacy.  ?- You may also stop by our office during regular business hours and pick up a GoodRx coupon card.  ?- If you need your prescription sent electronically to a different pharmacy, notify our office  through [facility] or by phone at [phone] option 4. ? ? ? ? ?Si Usted Necesita Algo Despu?s de Su Visita ? ?Tambi?n puede enviarnos un mensaje a trav?s de MyChart. Por lo general respondemos a los mensajes de MyChart en el transcurso de 1 a 2 d?as h?biles. ? ?Para renovar recetas, por favor pida a su farmacia que se ponga en contacto con nuestra oficina. Nuestro n?mero de fax es el [phone]. ? ?Si tiene un asunto urgente cuando la cl?nica est? cerrada y que no puede  esperar hasta el siguiente d?a h?bil, puede llamar/localizar a su doctor(a) al n?mero que aparece a continuaci?n.  ? ?Por favor, tenga en cuenta que aunque hacemos todo lo posible para estar disponibles para asuntos urgentes fuera del horario de oficina, no estamos disponibles las 24 horas del d?a, los 7 d?as de la semana.  ? ?Si tiene un problema urgente y no puede comunicarse con nosotros, puede optar por buscar atenci?n m?dica  en el consultorio de su doctor(a), en una cl?nica privada, en un centro de atenci?n urgente o en una sala de emergencias. ? ?Si tiene Engineer, maintenance (IT) m?dica, por favor llame inmediatamente al 911 o vaya a la sala de emergencias. ? ?N?meros de b?per ? ?- Dr. Nehemiah Massed: [phone] ? ?- Dra. Moye: [phone] ? ?- Dra. Nicole Kindred: [phone] ? ?En caso de inclemencias del tiempo, por favor llame a nuestra l?nea principal al [phone] para una actualizaci?n sobre el estado de cualquier retraso o cierre. ? ?Consejos para la medicaci?n en dermatolog?a: ?Por favor, guarde las cajas en las que vienen los medicamentos de uso t?pico para ayudarle a seguir las instrucciones sobre d?nde y c?mo usarlos. Las farmacias generalmente imprimen las instrucciones del medicamento s?lo en las cajas y no directamente en los tubos del [place].  ? ?Si su medicamento es muy caro, por favor, p?ngase en contacto con Zigmund Daniel llamando al [phone] y presione la opci?n 4 o env?enos un mensaje a trav?s de MyChart.  ? ?No podemos decirle cu?l ser? su copago por los medicamentos por adelantado ya que esto es diferente dependiendo de la cobertura de su seguro. Sin embargo, es posible que podamos encontrar un medicamento sustituto a Electrical engineer un formulario para que el seguro cubra el medicamento que se considera necesario.  ? ?Si se requiere Ardelia Mems autorizaci?n previa para que su compa??a de seguros [place] su medicamento, por favor perm?tanos de 1 a 2 d?as h?biles para completar este proceso. ? ?Los  precios de los medicamentos var?an con frecuencia dependiendo del Environmental consultant de d?nde se surte la receta y alguna farmacias pueden ofrecer precios m?s baratos. ? ?El sitio web www.goodrx.com tiene cupones para medicamentos de Airline pilot. Los precios aqu? no tienen en cuenta lo que podr?a costar con la ayuda del seguro (puede ser m?s barato con su seguro), pero el sitio web puede darle el precio si no utiliz? ning?n seguro.  ?- Puede imprimir el cup?n correspondiente y llevarlo con su receta a la farmacia.  ?- Tambi?n puede pasar por nuestra oficina durante el horario de atenci?n regular y recoger una tarjeta de cupones de GoodRx.  ?- Si necesita que su receta se env?e electr?nicamente a Chiropodist, informe a nuestra oficina a trav?s de MyChart de [place] o por tel?fono llamando al [phone] y presione la opci?n 4. ? ?

## 2022-01-30 NOTE — Progress Notes (Signed)
? ?  Follow-Up Visit ?  ?Subjective  ?Caleb Maldonado is a 86 y.o. male who presents for the following: Follow-up (Patient here today for AK and bx follow up. Biopsy proven BCC treated at right glabella with EDC at last visit. ). ? ? ?The following portions of the chart were reviewed this encounter and updated as appropriate:  ?  ?  ? ?Review of Systems:  No other skin or systemic complaints except as noted in HPI or Assessment and Plan. ? ?Objective  ?Well appearing patient in no apparent distress; mood and affect are within normal limits. ? ?A focused examination was performed including hands, face, chest. Relevant physical exam findings are noted in the Assessment and Plan. ? ?right infraocular x 1 ?Erythematous stuck-on, waxy papule ? ? ? ?Assessment & Plan  ?Inflamed seborrheic keratosis ?right infraocular x 1 ? ?Residual at right infraocular ? ?Destruction of lesion - right infraocular x 1 ? ?Destruction method: cryotherapy   ?Informed consent: discussed and consent obtained   ?Lesion destroyed using liquid nitrogen: Yes   ?Region frozen until ice ball extended beyond lesion: Yes   ?Outcome: patient tolerated procedure well with no complications   ?Post-procedure details: wound care instructions given   ?Additional details:  Prior to procedure, discussed risks of blister formation, small wound, skin dyspigmentation, or rare scar following cryotherapy. Recommend Vaseline ointment to treated areas while healing.  ? ? ?History of Basal Cell Carcinoma of the Skin ?- No evidence of recurrence today at L upper nasal dorsum, lower sternum, L post auricular neck, R nasal tip, R nasolabial, right glabella ?- Recommend regular full body skin exams ?- Recommend daily broad spectrum sunscreen SPF 30+ to sun-exposed areas, reapply every 2 hours as needed.  ?- Call if any new or changing lesions are noted between office visits ? ?Seborrheic Keratoses ?- Stuck-on, waxy, tan-brown papules and/or plaques  ?- Benign-appearing ?-  Discussed benign etiology and prognosis. ?- Observe ?- Call for any changes ? ?Actinic Damage ?- chronic, secondary to cumulative UV radiation exposure/sun exposure over time ?- diffuse scaly erythematous macules with underlying dyspigmentation ?- Recommend daily broad spectrum sunscreen SPF 30+ to sun-exposed areas, reapply every 2 hours as needed.  ?- Recommend staying in the shade or wearing long sleeves, sun glasses (UVA+UVB protection) and wide brim hats (4-inch brim around the entire circumference of the hat). ?- Call for new or changing lesions. ? ?Return in about 6 months (around 08/02/2022) for UBSE. ? ?Graciella Belton, RMA, am acting as scribe for [name], MD . ? ?Documentation: I have reviewed the above documentation for accuracy and completeness, and I agree with the above. ? ?[name] MD  ? ?

## 2022-04-17 DIAGNOSIS — E876 Hypokalemia: Secondary | ICD-10-CM | POA: Diagnosis not present

## 2022-04-17 DIAGNOSIS — I10 Essential (primary) hypertension: Secondary | ICD-10-CM | POA: Diagnosis not present

## 2022-04-23 ENCOUNTER — Emergency Department: Payer: Medicare HMO

## 2022-04-23 ENCOUNTER — Other Ambulatory Visit: Payer: Self-pay

## 2022-04-23 ENCOUNTER — Encounter: Payer: Self-pay | Admitting: Emergency Medicine

## 2022-04-23 DIAGNOSIS — I10 Essential (primary) hypertension: Secondary | ICD-10-CM | POA: Diagnosis present

## 2022-04-23 DIAGNOSIS — R1030 Lower abdominal pain, unspecified: Secondary | ICD-10-CM | POA: Diagnosis not present

## 2022-04-23 DIAGNOSIS — Z79899 Other long term (current) drug therapy: Secondary | ICD-10-CM | POA: Diagnosis not present

## 2022-04-23 DIAGNOSIS — Z85828 Personal history of other malignant neoplasm of skin: Secondary | ICD-10-CM | POA: Diagnosis not present

## 2022-04-23 DIAGNOSIS — N201 Calculus of ureter: Secondary | ICD-10-CM | POA: Diagnosis not present

## 2022-04-23 DIAGNOSIS — I4510 Unspecified right bundle-branch block: Secondary | ICD-10-CM | POA: Diagnosis present

## 2022-04-23 DIAGNOSIS — R7401 Elevation of levels of liver transaminase levels: Secondary | ICD-10-CM | POA: Diagnosis present

## 2022-04-23 DIAGNOSIS — E876 Hypokalemia: Secondary | ICD-10-CM | POA: Diagnosis present

## 2022-04-23 DIAGNOSIS — K760 Fatty (change of) liver, not elsewhere classified: Secondary | ICD-10-CM | POA: Diagnosis present

## 2022-04-23 DIAGNOSIS — N400 Enlarged prostate without lower urinary tract symptoms: Secondary | ICD-10-CM | POA: Diagnosis present

## 2022-04-23 DIAGNOSIS — N3289 Other specified disorders of bladder: Secondary | ICD-10-CM | POA: Diagnosis present

## 2022-04-23 DIAGNOSIS — R109 Unspecified abdominal pain: Secondary | ICD-10-CM | POA: Diagnosis not present

## 2022-04-23 DIAGNOSIS — K8020 Calculus of gallbladder without cholecystitis without obstruction: Secondary | ICD-10-CM | POA: Diagnosis present

## 2022-04-23 DIAGNOSIS — K7689 Other specified diseases of liver: Secondary | ICD-10-CM | POA: Diagnosis not present

## 2022-04-23 DIAGNOSIS — Z87442 Personal history of urinary calculi: Secondary | ICD-10-CM | POA: Diagnosis not present

## 2022-04-23 DIAGNOSIS — I493 Ventricular premature depolarization: Secondary | ICD-10-CM | POA: Diagnosis present

## 2022-04-23 DIAGNOSIS — Z85038 Personal history of other malignant neoplasm of large intestine: Secondary | ICD-10-CM

## 2022-04-23 DIAGNOSIS — N281 Cyst of kidney, acquired: Secondary | ICD-10-CM | POA: Diagnosis not present

## 2022-04-23 DIAGNOSIS — N132 Hydronephrosis with renal and ureteral calculous obstruction: Secondary | ICD-10-CM | POA: Diagnosis not present

## 2022-04-23 DIAGNOSIS — K862 Cyst of pancreas: Secondary | ICD-10-CM | POA: Diagnosis not present

## 2022-04-23 DIAGNOSIS — R945 Abnormal results of liver function studies: Secondary | ICD-10-CM | POA: Diagnosis not present

## 2022-04-23 DIAGNOSIS — N210 Calculus in bladder: Principal | ICD-10-CM | POA: Diagnosis present

## 2022-04-23 LAB — URINALYSIS, ROUTINE W REFLEX MICROSCOPIC
Glucose, UA: NEGATIVE mg/dL
Hgb urine dipstick: NEGATIVE
Ketones, ur: 20 mg/dL — AB
Leukocytes,Ua: NEGATIVE
Nitrite: NEGATIVE
Protein, ur: 30 mg/dL — AB
Specific Gravity, Urine: 1.024 (ref 1.005–1.030)
Squamous Epithelial / HPF: NONE SEEN (ref 0–5)
pH: 5 (ref 5.0–8.0)

## 2022-04-23 LAB — CBC
HCT: 48.7 % (ref 39.0–52.0)
Hemoglobin: 16.5 g/dL (ref 13.0–17.0)
MCH: 29.5 pg (ref 26.0–34.0)
MCHC: 33.9 g/dL (ref 30.0–36.0)
MCV: 87.1 fL (ref 80.0–100.0)
Platelets: 224 10*3/uL (ref 150–400)
RBC: 5.59 MIL/uL (ref 4.22–5.81)
RDW: 14 % (ref 11.5–15.5)
WBC: 11.1 10*3/uL — ABNORMAL HIGH (ref 4.0–10.5)
nRBC: 0 % (ref 0.0–0.2)

## 2022-04-23 LAB — COMPREHENSIVE METABOLIC PANEL
ALT: 326 U/L — ABNORMAL HIGH (ref 0–44)
AST: 157 U/L — ABNORMAL HIGH (ref 15–41)
Albumin: 3.7 g/dL (ref 3.5–5.0)
Alkaline Phosphatase: 176 U/L — ABNORMAL HIGH (ref 38–126)
Anion gap: 9 (ref 5–15)
BUN: 12 mg/dL (ref 8–23)
CO2: 21 mmol/L — ABNORMAL LOW (ref 22–32)
Calcium: 8.8 mg/dL — ABNORMAL LOW (ref 8.9–10.3)
Chloride: 109 mmol/L (ref 98–111)
Creatinine, Ser: 0.8 mg/dL (ref 0.61–1.24)
GFR, Estimated: >60 mL/min (ref >60)
Glucose, Bld: 125 mg/dL — ABNORMAL HIGH (ref 70–99)
Potassium: 3.1 mmol/L — ABNORMAL LOW (ref 3.5–5.1)
Sodium: 139 mmol/L (ref 135–145)
Total Bilirubin: 5.9 mg/dL — ABNORMAL HIGH (ref 0.3–1.2)
Total Protein: 6.8 g/dL (ref 6.5–8.1)

## 2022-04-23 LAB — LIPASE, BLOOD: Lipase: 27 U/L (ref 11–51)

## 2022-04-23 LAB — TROPONIN I (HIGH SENSITIVITY)
Troponin I (High Sensitivity): 7 ng/L (ref <18)
Troponin I (High Sensitivity): 9 ng/L (ref <18)

## 2022-04-23 NOTE — ED Provider Triage Note (Signed)
Emergency Medicine Provider Triage Evaluation Note  Caleb Maldonado , a 86 y.o. male  was evaluated in triage.  Pt complains of abdominal pain that began earlier today.  Patient states pain began 3 hours ago, currently not having more discomfort.  He denies any chest pain, shortness of breath, fevers, cough.  Has had a normal bowel movement yesterday.  States he has a history of kidney stones but denies any flank pain.  Review of Systems  Positive: Abdominal pain, Negative: Fever, chills, cough, chest pain, shortness of breath, urinary symptoms  Physical Exam  BP (!) 166/105 (BP Location: Right Arm)   Pulse 82   Temp 98.4 F (36.9 C) (Oral)   Resp 17   SpO2 97%  Gen:   Awake, no distress   Resp:  Normal effort  MSK:   Moves extremities without difficulty  Other:    Medical Decision Making  Medically screening exam initiated at 6:57 PM.  Appropriate orders placed.  [name] was informed that the remainder of the evaluation will be completed by another provider, this initial triage assessment does not replace that evaluation, and the importance of remaining in the ED until their evaluation is complete.     [name], [place] 04/23/22 1858

## 2022-04-23 NOTE — ED Triage Notes (Signed)
Pt reports lower abdominal pain that started around 1430 today and lasted approx 1hr. Pt denies fever, N/V. Denies urinary symptoms.

## 2022-04-24 ENCOUNTER — Inpatient Hospital Stay
Admission: EM | Admit: 2022-04-24 | Discharge: 2022-04-26 | DRG: 694 | Disposition: A | Discharge status: Home / Self Care | Payer: Medicare HMO | Attending: Internal Medicine | Admitting: Internal Medicine

## 2022-04-24 ENCOUNTER — Emergency Department: Payer: Medicare HMO

## 2022-04-24 ENCOUNTER — Inpatient Hospital Stay: Payer: Medicare HMO

## 2022-04-24 ENCOUNTER — Encounter: Payer: Self-pay | Admitting: Internal Medicine

## 2022-04-24 DIAGNOSIS — E876 Hypokalemia: Secondary | ICD-10-CM | POA: Diagnosis present

## 2022-04-24 DIAGNOSIS — I4510 Unspecified right bundle-branch block: Secondary | ICD-10-CM | POA: Diagnosis present

## 2022-04-24 DIAGNOSIS — N210 Calculus in bladder: Secondary | ICD-10-CM | POA: Diagnosis present

## 2022-04-24 DIAGNOSIS — N132 Hydronephrosis with renal and ureteral calculous obstruction: Secondary | ICD-10-CM

## 2022-04-24 DIAGNOSIS — Z79899 Other long term (current) drug therapy: Secondary | ICD-10-CM | POA: Diagnosis not present

## 2022-04-24 DIAGNOSIS — N400 Enlarged prostate without lower urinary tract symptoms: Secondary | ICD-10-CM | POA: Diagnosis present

## 2022-04-24 DIAGNOSIS — I493 Ventricular premature depolarization: Secondary | ICD-10-CM | POA: Diagnosis present

## 2022-04-24 DIAGNOSIS — Z85038 Personal history of other malignant neoplasm of large intestine: Secondary | ICD-10-CM | POA: Diagnosis not present

## 2022-04-24 DIAGNOSIS — K760 Fatty (change of) liver, not elsewhere classified: Secondary | ICD-10-CM | POA: Diagnosis present

## 2022-04-24 DIAGNOSIS — Z85828 Personal history of other malignant neoplasm of skin: Secondary | ICD-10-CM | POA: Diagnosis not present

## 2022-04-24 DIAGNOSIS — R7401 Elevation of levels of liver transaminase levels: Principal | ICD-10-CM

## 2022-04-24 DIAGNOSIS — K8020 Calculus of gallbladder without cholecystitis without obstruction: Secondary | ICD-10-CM | POA: Diagnosis present

## 2022-04-24 DIAGNOSIS — I10 Essential (primary) hypertension: Secondary | ICD-10-CM | POA: Diagnosis present

## 2022-04-24 DIAGNOSIS — N3289 Other specified disorders of bladder: Secondary | ICD-10-CM | POA: Diagnosis present

## 2022-04-24 DIAGNOSIS — N200 Calculus of kidney: Secondary | ICD-10-CM

## 2022-04-24 DIAGNOSIS — Z87442 Personal history of urinary calculi: Secondary | ICD-10-CM | POA: Diagnosis not present

## 2022-04-24 HISTORY — DX: Essential (primary) hypertension: I10

## 2022-04-24 LAB — BILIRUBIN, FRACTIONATED(TOT/DIR/INDIR)
Bilirubin, Direct: 2 mg/dL — ABNORMAL HIGH (ref 0.0–0.2)
Indirect Bilirubin: 1.8 mg/dL — ABNORMAL HIGH (ref 0.3–0.9)
Total Bilirubin: 3.8 mg/dL — ABNORMAL HIGH (ref 0.3–1.2)

## 2022-04-24 LAB — HEPATITIS PANEL, ACUTE
HCV Ab: NONREACTIVE
Hep A IgM: NONREACTIVE
Hep B C IgM: NONREACTIVE
Hepatitis B Surface Ag: NONREACTIVE

## 2022-04-24 LAB — IRON AND TIBC
Iron: 118 ug/dL (ref 45–182)
Saturation Ratios: 47 % — ABNORMAL HIGH (ref 17.9–39.5)
TIBC: 249 ug/dL — ABNORMAL LOW (ref 250–450)
UIBC: 131 ug/dL

## 2022-04-24 LAB — FERRITIN: Ferritin: 330 ng/mL (ref 24–336)

## 2022-04-24 LAB — GAMMA GT: GGT: 109 U/L — ABNORMAL HIGH (ref 7–50)

## 2022-04-24 LAB — SALICYLATE LEVEL: Salicylate Lvl: <7 mg/dL — ABNORMAL LOW (ref 7.0–30.0)

## 2022-04-24 LAB — PROTIME-INR
INR: 1 (ref 0.8–1.2)
Prothrombin Time: 13.2 seconds (ref 11.4–15.2)

## 2022-04-24 LAB — ACETAMINOPHEN LEVEL: Acetaminophen (Tylenol), Serum: <10 ug/mL — ABNORMAL LOW (ref 10–30)

## 2022-04-24 LAB — MAGNESIUM: Magnesium: 2.2 mg/dL (ref 1.7–2.4)

## 2022-04-24 MED ORDER — ACETAMINOPHEN 650 MG RE SUPP: 650.0000 mg | Freq: Four times a day (QID) | RECTAL | Status: DC | PRN

## 2022-04-24 MED ORDER — ONDANSETRON HCL 4 MG/2ML IJ SOLN: 4.0000 mg | Freq: Four times a day (QID) | INTRAMUSCULAR | Status: DC | PRN

## 2022-04-24 MED ORDER — LISINOPRIL 20 MG PO TABS
20.0000 mg | ORAL_TABLET | Freq: Every day | ORAL | Status: DC
  Administered 2022-04-24 – 2022-04-26 (×3): 20 mg via ORAL
  Filled 2022-04-24: qty 2
  Filled 2022-04-24 (×2): qty 1

## 2022-04-24 MED ORDER — POTASSIUM CHLORIDE CRYS ER 20 MEQ PO TBCR
40.0000 meq | EXTENDED_RELEASE_TABLET | Freq: Once | ORAL | Status: AC
  Administered 2022-04-24: 40 meq via ORAL
  Filled 2022-04-24: qty 2

## 2022-04-24 MED ORDER — SODIUM CHLORIDE 0.9% FLUSH: 3.0000 mL | INTRAVENOUS | Status: DC | PRN

## 2022-04-24 MED ORDER — POTASSIUM CHLORIDE 10 MEQ/100ML IV SOLN
10.0000 meq | INTRAVENOUS | Status: AC
  Administered 2022-04-24 (×2): 10 meq via INTRAVENOUS
  Filled 2022-04-24: qty 100

## 2022-04-24 MED ORDER — SODIUM CHLORIDE 0.9 % IV SOLN
1.0000 g | INTRAVENOUS | Status: AC
  Administered 2022-04-24: 1 g via INTRAVENOUS
  Filled 2022-04-24: qty 10

## 2022-04-24 MED ORDER — HYDROCODONE-ACETAMINOPHEN 5-325 MG PO TABS: 1.0000 | ORAL_TABLET | ORAL | Status: DC | PRN

## 2022-04-24 MED ORDER — ADULT MULTIVITAMIN W/MINERALS CH
1.0000 | ORAL_TABLET | Freq: Every day | ORAL | Status: DC
  Administered 2022-04-24 – 2022-04-26 (×2): 1 via ORAL
  Filled 2022-04-24 (×2): qty 1

## 2022-04-24 MED ORDER — ONDANSETRON HCL 4 MG PO TABS: 4.0000 mg | ORAL_TABLET | Freq: Four times a day (QID) | ORAL | Status: DC | PRN

## 2022-04-24 MED ORDER — SODIUM CHLORIDE 0.9 % IV SOLN
1.0000 g | Freq: Once | INTRAVENOUS | Status: DC
  Filled 2022-04-24 (×2): qty 10

## 2022-04-24 MED ORDER — ACETAMINOPHEN 325 MG PO TABS: 650.0000 mg | ORAL_TABLET | Freq: Four times a day (QID) | ORAL | Status: DC | PRN

## 2022-04-24 MED ORDER — SODIUM CHLORIDE 0.9 % IV SOLN: 250.0000 mL | INTRAVENOUS | Status: DC | PRN

## 2022-04-24 MED ORDER — SODIUM CHLORIDE 0.9% FLUSH
3.0000 mL | Freq: Two times a day (BID) | INTRAVENOUS | Status: DC
  Administered 2022-04-24 – 2022-04-26 (×4): 3 mL via INTRAVENOUS

## 2022-04-24 MED ORDER — GADOBUTROL 1 MMOL/ML IV SOLN
8.0000 mL | Freq: Once | INTRAVENOUS | Status: AC | PRN
  Administered 2022-04-24: 8 mL via INTRAVENOUS

## 2022-04-24 MED ORDER — SODIUM CHLORIDE 0.9 % IV BOLUS
500.0000 mL | Freq: Once | INTRAVENOUS | Status: AC
  Administered 2022-04-24: 500 mL via INTRAVENOUS

## 2022-04-24 MED ORDER — IOHEXOL 300 MG/ML  SOLN
100.0000 mL | Freq: Once | INTRAMUSCULAR | Status: AC | PRN
  Administered 2022-04-24: 100 mL via INTRAVENOUS

## 2022-04-24 NOTE — ED Notes (Signed)
Breakfast tray at bedside 

## 2022-04-24 NOTE — Consult Note (Signed)
Urology Consult  Requesting physician: Collier Bullock, MD  Reason for consultation: Ureteral calculus   Chief Complaint: None  History of Present Illness: Caleb Maldonado is a 86 y.o. male who presented to the [facility] ED yesterday with intermittent episodes of lower abdominal pain over the last 30 days.  On the morning of his ED visit he experienced an episode with increased severity and duration.  Denied fever, chills or lower urinary tract symptoms.  Urinalysis showed moderate bilirubin but no RBC/WBC.  LFTs and total bilirubin were elevated and currently undergoing evaluation by gastroenterology.  Right upper quadrant ultrasound showed cholelithiasis without obstruction.  CT showed a 10 mm left UVJ calculus without hydronephrosis.  There was mild left periureteral stranding.  Prominent BPH noted.  A CT 2015 showed a 4 mm left distal ureteral calculus   Past Medical History:  Diagnosis Date   Actinic keratosis    Basal cell carcinoma 12/20/2015   R nasal tip, R nasolabial    Basal cell carcinoma 01/12/2020   L upper nasal dorsum (clear w/bx), recurrent bx/edc 05/02/2021   Basal cell carcinoma 01/12/2020   Lower sternum, EDC 04/05/20    Basal cell carcinoma 02/26/2019   L post auricular neck    BCC (basal cell carcinoma of skin) 08/02/2021   R glabella, EDC    History reviewed. No pertinent surgical history.  Home Medications:  Current Meds  Medication Sig   lisinopril (ZESTRIL) 40 MG tablet Take 40 mg by mouth daily.   Multiple Vitamins-Minerals (CENTRUM SILVER PO) Take by mouth.    Allergies: No Known Allergies  No family history on file.  Social History:  reports that he has never smoked. He has never used smokeless tobacco. No history on file for alcohol use and drug use.  ROS: A complete review of systems was performed.  All systems are negative except for pertinent findings as noted.  Physical Exam:  Vital signs in last 24 hours: Temp:  [97.6 F (36.4 C)-98.4 F  (36.9 C)] 98 F (36.7 C) (07/24 1034) Pulse Rate:  [31-83] 72 (07/24 1030) Resp:  [14-24] 16 (07/24 1030) BP: (143-184)/(66-118) 170/118 (07/24 1030) SpO2:  [93 %-99 %] 94 % (07/24 1030) Constitutional:  Alert,, No acute distress HEENT: [place] AT, moist mucus membranes.  Trachea midline, no masses Cardiovascular: Regular rate and rhythm, no clubbing, cyanosis, or edema. Respiratory: Normal respiratory effort, lungs clear bilaterally GI: Abdomen is soft, nontender, nondistended, no abdominal masses GU: No CVA tenderness Skin: No rashes, bruises or suspicious lesions Lymph: No cervical or inguinal adenopathy Neurologic: Grossly intact, no focal deficits, moving all 4 extremities Psychiatric: Normal mood and affect   Laboratory Data:  Recent Labs    04/23/22 1902  WBC 11.1*  HGB 16.5  HCT 48.7   Recent Labs    04/23/22 1902  NA 139  K 3.1*  CL 109  CO2 21*  GLUCOSE 125*  BUN 12  CREATININE 0.80  CALCIUM 8.8*   Recent Labs    04/24/22 0446  INR 1.0    Radiologic Imaging: CT images were personally reviewed and interpreted.  Calculus probably an intramural ureter but may also be bladder  CT ABDOMEN PELVIS W CONTRAST  Result Date: 04/24/2022 CLINICAL DATA:  Abdominal pain and abnormal LFTs. EXAM: CT ABDOMEN AND PELVIS WITH CONTRAST TECHNIQUE: Multidetector CT imaging of the abdomen and pelvis was performed using the standard protocol following bolus administration of intravenous contrast. RADIATION DOSE REDUCTION: This exam was performed according to the departmental  dose-optimization program which includes automated exposure control, adjustment of the mA and/or kV according to patient size and/or use of iterative reconstruction technique. CONTRAST:  139m OMNIPAQUE IOHEXOL 300 MG/ML  SOLN COMPARISON:  None Available. FINDINGS: Lower Chest: Normal. Hepatobiliary: Normal hepatic contours. No intra- or extrahepatic biliary dilatation. The gallbladder is normal. Pancreas: Normal  pancreas. No ductal dilatation or peripancreatic fluid collection. Spleen: Normal. Adrenals/Urinary Tract: The adrenal glands are normal. There is a 10 mm stone at the left ureterovesical junction. There is no hydronephrosis. Mild left periureteral stranding. No other renal or ureteral calculi. The urinary bladder is normal for degree of distention Stomach/Bowel: There is no hiatal hernia. Normal duodenal course and caliber. No small bowel dilatation or inflammation. No focal colonic abnormality. Normal appendix. Vascular/Lymphatic: There is calcific atherosclerosis of the abdominal aorta. No lymphadenopathy. Reproductive: Enlarged prostate measures 6 cm in transverse dimension. Other: None. Musculoskeletal: No bony spinal canal stenosis or focal osseous abnormality. IMPRESSION: 1. A 10 mm stone at the left ureterovesical junction without hydronephrosis. 2. Enlarged prostate. Electronically Signed   By: KUlyses JarredM.D.   On: 04/24/2022 03:34   [place] LIMITED RUQ (LIVER/GB)  Result Date: 04/24/2022 CLINICAL DATA:  Abdominal pain, abnormal LFTs EXAM: ULTRASOUND ABDOMEN LIMITED RIGHT UPPER QUADRANT COMPARISON:  CT abdomen/pelvis dated 04/09/2014 FINDINGS: Gallbladder: Multiple gallstones measuring up to 11 mm. No gallbladder wall thickening or pericholecystic fluid. Negative sonographic Murphy's sign. Common bile duct: Diameter: 4 mm Liver: Hyperechoic hepatic parenchyma, suggesting hepatic steatosis. No focal hepatic lesion is seen. Portal vein is patent on color Doppler imaging with normal direction of blood flow towards the liver. Other: None. IMPRESSION: Cholelithiasis, without associated sonographic findings to suggest acute cholecystitis. Suspected hepatic steatosis. Electronically Signed   By: SJulian HyM.D.   On: 04/24/2022 00:02    Impression/Assessment:   1.  Left distal ureteral calculus We discussed based on stone size unlikely he will be able to pass spontaneously Best option for a  10 mm calculus in the distal ureter is ureteroscopic removal.  The procedure was discussed including potential risks of bleeding, infection and ureteral injury.  The need for a postoperative ureteral stent was discussed The alternative of SWL was discussed.  There is a lower complication rate with this procedure however would not be as effective  Plan:  He has elected to undergo ureteroscopic stone removal during this admission and will add to my OR schedule tomorrow    04/24/2022, 1:22 PM  SJohn Giovanni  MD

## 2022-04-24 NOTE — Assessment & Plan Note (Signed)
Patient with a history of nephrolithiasis who presents to the ER for intermittent episodes of bilateral flank/lower quadrant pain and found to have a 10 mm stone at the left ureterovesical junction without hydronephrosis. Pain control as needed Consult urology

## 2022-04-24 NOTE — ED Notes (Signed)
Pt resting in bed. Provided wet washcloth to pt to wipe face.

## 2022-04-24 NOTE — Assessment & Plan Note (Signed)
Continue lisinopril for blood pressure control

## 2022-04-24 NOTE — ED Provider Notes (Signed)
[facility] Provider Note    Event Date/Time   First MD Initiated Contact with Patient 04/24/22 0231     (approximate)   History   Abdominal Pain   HPI  Caleb Maldonado is a 86 y.o. male who on review of primary care note from April of this year has a history of hypertension prior history of GI bleeding as well as kidney stones basal cell carcinoma and right bundle branch block  Patient has been experiencing intermittent episodes of a sort of lower mid abdominal pain that will occur off and on for about a month, but had a particularly severe and somewhat long existing episode this morning prompting him to come the emergency room.  He also reports he noticed his urine has been dark.  No fevers no chills.  All pain and symptoms are alleviated now they went away on their own  He took 2 Tylenol today, but typically does not take any Tylenol products.  He does not drink or use alcohol denies history of hepatitis liver disease or gallbladder problems.  No chest pain or trouble breathing.  Currently no pain or symptoms.     Physical Exam   Triage Vital Signs: ED Triage Vitals  Enc Vitals Group     BP 04/23/22 1853 (!) 166/105     Pulse Rate 04/23/22 1853 82     Resp 04/23/22 1853 17     Temp 04/23/22 1853 98.4 F (36.9 C)     Temp Source 04/23/22 1853 Oral     SpO2 04/23/22 1853 97 %     Weight --      Height --      Head Circumference --      Peak Flow --      Pain Score 04/23/22 1851 0     Pain Loc --      Pain Edu? --      Excl. in [place]? --     Most recent vital signs: Vitals:   04/24/22 0700 04/24/22 0730  BP: (!) 146/87 (!) 145/78  Pulse: (!) 59 83  Resp: (!) 24 19  Temp:    SpO2: 94% 99%     General: Awake, no distress.  CV:  Good peripheral perfusion.  Normal heart tones, irregular with frequent PVCs noted.  ECG rate of approximately 70, but not perfusing on all PVCs with a resting pulse of approximately 35-40  Resp:  Normal effort.   Clear lungs bilaterally Abd:  No distention.  Soft nondistended, reports mild tenderness in the suprapubic region.  Negative Murphy.  No right upper quadrant tenderness.  No left upper quadrant tenderness.  No right lower quadrant tenderness. Other:  Alert, well oriented pleasant.   ED Results / Procedures / Treatments   Labs (all labs ordered are listed, but only abnormal results are displayed) Labs Reviewed  COMPREHENSIVE METABOLIC PANEL - Abnormal; Notable for the following components:      Result Value   Potassium 3.1 (*)    CO2 21 (*)    Glucose, Bld 125 (*)    Calcium 8.8 (*)    AST 157 (*)    ALT 326 (*)    Alkaline Phosphatase 176 (*)    Total Bilirubin 5.9 (*)    All other components within normal limits  CBC - Abnormal; Notable for the following components:   WBC 11.1 (*)    All other components within normal limits  URINALYSIS, ROUTINE W REFLEX MICROSCOPIC - Abnormal; Notable  for the following components:   Color, Urine AMBER (*)    APPearance CLEAR (*)    Bilirubin Urine MODERATE (*)    Ketones, ur 20 (*)    Protein, ur 30 (*)    Bacteria, UA RARE (*)    All other components within normal limits  URINE CULTURE  LIPASE, BLOOD  MAGNESIUM  PROTIME-INR  HEPATITIS PANEL, ACUTE  TROPONIN I (HIGH SENSITIVITY)  TROPONIN I (HIGH SENSITIVITY)     EKG  Interpreted by me at 1900 heart rate 80 QRS 170 QTc 510 Normal sinus rhythm with frequent PVCs.  Appears to be sinus rhythm with frequent PVCs with underlying right bundle branch block morphology.  No obvious acute ischemic abnormalities denoted  CT ABDOMEN PELVIS W CONTRAST  Result Date: 04/24/2022 CLINICAL DATA:  Abdominal pain and abnormal LFTs. EXAM: CT ABDOMEN AND PELVIS WITH CONTRAST TECHNIQUE: Multidetector CT imaging of the abdomen and pelvis was performed using the standard protocol following bolus administration of intravenous contrast. RADIATION DOSE REDUCTION: This exam was performed according to the  departmental dose-optimization program which includes automated exposure control, adjustment of the mA and/or kV according to patient size and/or use of iterative reconstruction technique. CONTRAST:  157m OMNIPAQUE IOHEXOL 300 MG/ML  SOLN COMPARISON:  None Available. FINDINGS: Lower Chest: Normal. Hepatobiliary: Normal hepatic contours. No intra- or extrahepatic biliary dilatation. The gallbladder is normal. Pancreas: Normal pancreas. No ductal dilatation or peripancreatic fluid collection. Spleen: Normal. Adrenals/Urinary Tract: The adrenal glands are normal. There is a 10 mm stone at the left ureterovesical junction. There is no hydronephrosis. Mild left periureteral stranding. No other renal or ureteral calculi. The urinary bladder is normal for degree of distention Stomach/Bowel: There is no hiatal hernia. Normal duodenal course and caliber. No small bowel dilatation or inflammation. No focal colonic abnormality. Normal appendix. Vascular/Lymphatic: There is calcific atherosclerosis of the abdominal aorta. No lymphadenopathy. Reproductive: Enlarged prostate measures 6 cm in transverse dimension. Other: None. Musculoskeletal: No bony spinal canal stenosis or focal osseous abnormality. IMPRESSION: 1. A 10 mm stone at the left ureterovesical junction without hydronephrosis. 2. Enlarged prostate. Electronically Signed   By: KUlyses JarredM.D.   On: 04/24/2022 03:34   [place] LIMITED RUQ (LIVER/GB)  Result Date: 04/24/2022 CLINICAL DATA:  Abdominal pain, abnormal LFTs EXAM: ULTRASOUND ABDOMEN LIMITED RIGHT UPPER QUADRANT COMPARISON:  CT abdomen/pelvis dated 04/09/2014 FINDINGS: Gallbladder: Multiple gallstones measuring up to 11 mm. No gallbladder wall thickening or pericholecystic fluid. Negative sonographic Murphy's sign. Common bile duct: Diameter: 4 mm Liver: Hyperechoic hepatic parenchyma, suggesting hepatic steatosis. No focal hepatic lesion is seen. Portal vein is patent on color Doppler imaging with  normal direction of blood flow towards the liver. Other: None. IMPRESSION: Cholelithiasis, without associated sonographic findings to suggest acute cholecystitis. Suspected hepatic steatosis. Electronically Signed   By: SJulian HyM.D.   On: 04/24/2022 00:02       RADIOLOGY  CT abdomen interpreted by me notable for large kidney stone noted on the left  PROCEDURES:  Critical Care performed: No  Procedures   MEDICATIONS ORDERED IN ED: Medications  sodium chloride 0.9 % bolus 500 mL (0 mLs Intravenous Stopped 04/24/22 0544)  potassium chloride 10 mEq in 100 mL IVPB (0 mEq Intravenous Stopped 04/24/22 0544)  iohexol (OMNIPAQUE) 300 MG/ML solution 100 mL (100 mLs Intravenous Contrast Given 04/24/22 0316)  cefTRIAXone (ROCEPHIN) 1 g in sodium chloride 0.9 % 100 mL IVPB (0 g Intravenous Stopped 04/24/22 0751)     IMPRESSION / MDM /  ASSESSMENT AND PLAN / ED COURSE  I reviewed the triage vital signs and the nursing notes.                              Differential diagnosis includes, but is not limited to, nephrolithiasis, pyelonephritis, colitis, obstruction, choledocholithiasis, cholecystitis, hepatitis etc.  After completion of imaging studies the patient has a large left-sided kidney stone which may correspond well with his intermittent lower abdominal pain he is experiencing for a month.  However it is notable he has significant transaminitis and elevated bilirubin however he lacks right upper quadrant pain no fevers no proceeding history of liver disease.  He does not take Tylenol regularly is only taken a total of 2 Tylenol tablets today.  His imaging studies at this point do not show evidence of biliary obstruction.  Discussed case with GI Dr. Alice Reichert  Repleted potassium  Patient's presentation is most consistent with acute illness / injury with system symptoms.  The patient is on the cardiac monitor to evaluate for evidence of arrhythmia and/or significant heart rate  changes.  Clinical Course as of 04/24/22 0804  Mon Apr 24, 2022  0410 Dr. Alice Reichert advises biochemical elevation of LFTS and bilirubin is suspected, but does not  [MQ]    Clinical Course User Index [MQ] Delman Kitten, MD   Urinalysis notable for rare bacteria.  Discussed patient's case with Dr. Bernardo Heater of urology, given the size of the patient's stone he recommends the patient be admitted to the hospitalist service and that urology will provide consultation for him today and he anticipates that he would consider operative intervention on Tuesday, but not today.  Additionally as bacteria is noted in the urine slightly elevated white count he does recommend initiate antibiotic for which I have ordered Rocephin.   Consulted with the hospitalist with admission request, patient to be admitted for care and treatment of a large left-sided kidney stone, also further observation and evaluation for notable transaminitis.  Discussed with hospitalist Dr. Damita Dunnings.  Dr. Bernardo Heater advises he will be providing consultation for the patient today as an inpatient    FINAL CLINICAL IMPRESSION(S) / ED DIAGNOSES   Final diagnoses:  Transaminitis  Nephrolithiasis  Hypokalemia  Frequent PVCs     Rx / DC Orders   ED Discharge Orders     None        Note:  This document was prepared using Dragon voice recognition software and may include unintentional dictation errors.   Delman Kitten, MD 04/24/22 [phone]

## 2022-04-24 NOTE — ED Notes (Signed)
Report to reina, rn.  

## 2022-04-24 NOTE — ED Notes (Signed)
Potassium drip decreased in rate due to burning in pt's arm. Pt with resolution of burning after drip rate decreased.

## 2022-04-24 NOTE — H&P (Signed)
History and Physical    Patient: Caleb Maldonado NMM:[id number] DOB: 1936/05/28 DOA: 04/24/2022 DOS: the patient was seen and examined on 04/24/2022 PCP: [name], MD  Patient coming from: Home  Chief Complaint:  Chief Complaint  Patient presents with   Abdominal Pain   HPI: Caleb Maldonado is a 86 y.o. male with medical history significant for hypertension, prior history of GI bleed, nephrolithiasis, basal cell carcinoma who presents to the ER for evaluation of intermittent episodes of lower abdominal pain over the last 1 month. He presented to the ER for evaluation due to the severity of the episode he had on that day and the prolonged duration.  He rated the pain an 8 x 10 in intensity at its worst in both lower quadrants and nonradiating.  Pain was associated with some dark-colored urine but he denied having any fever, no chills, no hematuria.  He states that the pain resolved a couple of hours later and during my evaluation he denied having any pain. He denies having any chest pain, no shortness of breath, no headache, no dizziness, no lightheadedness, no changes in his bowel habits, no cough, no leg swelling, no palpitations or diaphoresis. He had a renal stone CT which showed a 10 mm stone at the left ureterovesical junction without hydronephrosis.  Enlarged prostate. He will be admitted to the hospital for further evaluation. Review of Systems: As mentioned in the history of present illness. All other systems reviewed and are negative. Past Medical History:  Diagnosis Date   Actinic keratosis    Basal cell carcinoma 12/20/2015   R nasal tip, R nasolabial    Basal cell carcinoma 01/12/2020   L upper nasal dorsum (clear w/bx), recurrent bx/edc 05/02/2021   Basal cell carcinoma 01/12/2020   Lower sternum, EDC 04/05/20    Basal cell carcinoma 02/26/2019   L post auricular neck    BCC (basal cell carcinoma of skin) 08/02/2021   R glabella, EDC   History reviewed. No pertinent  surgical history. Social History:  reports that he has never smoked. He has never used smokeless tobacco. No history on file for alcohol use and drug use.  No Known Allergies  No family history on file.  Prior to Admission medications   Medication Sig Start Date End Date Taking? Authorizing Provider  lisinopril (ZESTRIL) 20 MG tablet  07/04/19   [provider]  Multiple Vitamins-Minerals (CENTRUM SILVER PO) Take by mouth.    [provider]    Physical Exam: Vitals:   04/24/22 0444 04/24/22 0700 04/24/22 0730 04/24/22 0800  BP: (!) 158/97 (!) 146/87 (!) 145/78 (!) 155/102  Pulse: 79 (!) 59 83 67  Resp: 16 (!) 24 19 14   Temp: 97.6 F (36.4 C)     TempSrc:      SpO2: 94% 94% 99% 93%   Physical Exam Vitals and nursing note reviewed.  Constitutional:      Appearance: He is well-developed.  HENT:     Mouth/Throat:     Mouth: Mucous membranes are moist.  Eyes:     Extraocular Movements: Extraocular movements intact.  Cardiovascular:     Rate and Rhythm: Normal rate and regular rhythm.  Pulmonary:     Effort: Pulmonary effort is normal.     Breath sounds: Normal breath sounds.  Abdominal:     General: Abdomen is flat. Bowel sounds are normal.     Palpations: Abdomen is soft.  Skin:    General: Skin is warm and dry.  Neurological:     General: No focal deficit present.     Mental Status: He is alert.  Psychiatric:        Mood and Affect: Mood normal.        Behavior: Behavior normal.     Data Reviewed: Relevant notes from primary care and specialist visits, past discharge summaries as available in EHR, including Care Everywhere. Prior diagnostic testing as pertinent to current admission diagnoses Updated medications and problem lists for reconciliation ED course, including vitals, labs, imaging, treatment and response to treatment Triage notes, nursing and pharmacy notes and ED provider's notes Notable results as noted in HPI Labs reviewed.  Sodium  139, potassium 3.1, chloride 109, bicarb 21, glucose 125, BUN 12, creatinine 0.80, calcium 8.8, total protein 6.8, albumin 3.7, AST 157, ALT 326, alk phos 176, total bilirubin 5.9, white count 11.1, hemoglobin 16.5, hematocrit 40.7, platelets 224 Urine analysis is sterile, magnesium 2.2, PT 1.0 Right upper quadrant ultrasound shows Cholelithiasis, without associated sonographic findings to suggest acute cholecystitis.Suspected hepatic steatosis. Renal stone CT shows a 10 mm stone at the left ureterovesical junction without hydronephrosis. Enlarged prostate. Twelve-lead EKG shows left axis deviation.  Right bundle branch block. There are no new results to review at this time.  Assessment and Plan: * Ureteral stone with hydronephrosis Patient with a history of nephrolithiasis who presents to the ER for intermittent episodes of bilateral flank/lower quadrant pain and found to have a 10 mm stone at the left ureterovesical junction without hydronephrosis. Pain control as needed Consult urology  Transaminitis Patient noted to have marked transaminitis Gallbladder ultrasound shows cholelithiasis, without associated sonographic findings to suggest acute cholecystitis as well as hepatic steatosis. Surgical referral  Essential hypertension Continue lisinopril for blood pressure control      Advance Care Planning:   Code Status: Full Code   Consults: Urology, surgery  Family Communication: Greater than 50% of time was spent discussing patient's condition and plan of care with him at the bedside.  All questions and concerns have been addressed.  He verbalizes understanding and agrees with the plan.  Severity of Illness: The appropriate patient status for this patient is INPATIENT. Inpatient status is judged to be reasonable and necessary in order to provide the required intensity of service to ensure the patient's safety. The patient's presenting symptoms, physical exam findings, and initial  radiographic and laboratory data in the context of their chronic comorbidities is felt to place them at high risk for further clinical deterioration. Furthermore, it is not anticipated that the patient will be medically stable for discharge from the hospital within 2 midnights of admission.   * I certify that at the point of admission it is my clinical judgment that the patient will require inpatient hospital care spanning beyond 2 midnights from the point of admission due to high intensity of service, high risk for further deterioration and high frequency of surveillance required.*  Author: Collier Bullock, MD 04/24/2022 9:18 AM  For on call review [email or web address].

## 2022-04-24 NOTE — Assessment & Plan Note (Signed)
Patient noted to have marked transaminitis Gallbladder ultrasound shows cholelithiasis, without associated sonographic findings to suggest acute cholecystitis as well as hepatic steatosis. Surgical referral

## 2022-04-24 NOTE — Consult Note (Signed)
Inpatient Consultation   Patient ID: Caleb Maldonado is a 86 y.o. male.  Requesting Provider: Dr. 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  Gastroenterology is consulted for transaminitis.  Wife is at bedside who helps provide history.  Pain has been ongoing waxing and waning for the last month.  He was found to have nephrolithiasis that is being evaluated by urology.  He reports his bowel movements have been moving regularly without blood or hematochezia.  He denies any constipation or diarrhea.  His appetite and weight have been stable and he denies night sweats.  No nausea vomiting dysphagia or odynophagia. Has noted dark colored urine  On imaging he was found to have gallstones on ultrasound and fatty liver disease.  CT was also performed demonstrating a nondilated CBD of 4 mm.  Liver Doppler was normal.  AST 157 ALT 326 total bili 5.9 lipase 27 alk phos 176 creatinine 0.8 INR 1.0.  White count 11.1 hemoglobin 16.5 MCV 87.  The patient denies any regular Tylenol use.  Denies tobacco and alcohol use.  No personal or family history of liver disease.  They deny any new medications or changes in dosing.  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 No pertinent surgical history.  No Known Allergies  No family history on file.  Social History   Tobacco Use   Smoking status: Never   Smokeless tobacco: Never     Pertinent GI related history and allergies were reviewed with the patient  Review of Systems  Constitutional:  Negative for activity change, appetite change, chills, diaphoresis, fatigue, fever and unexpected weight change.  HENT:  Negative for trouble swallowing and voice change.   Respiratory:  Negative for shortness of breath and wheezing.   Cardiovascular:  Negative for chest pain, palpitations and leg swelling.  Gastrointestinal:  Positive for abdominal pain (lower). Negative for abdominal distention, anal bleeding, blood in stool, constipation, diarrhea, nausea and vomiting.  Genitourinary:        +Dark urine  Musculoskeletal:  Negative for arthralgias and myalgias.  Skin:  Negative for color change and pallor.  Neurological:  Negative for dizziness, syncope and weakness.  Psychiatric/Behavioral:  Negative for confusion. The patient is not nervous/anxious.   All other systems reviewed and are negative.  Medications Home Medications No current facility-administered medications on file prior to encounter.   Current Outpatient Medications on File Prior to Encounter  Medication Sig Dispense Refill   lisinopril (ZESTRIL) 40 MG tablet Take 40 mg by mouth daily.     Multiple Vitamins-Minerals (CENTRUM SILVER PO) Take by mouth.     lisinopril  (ZESTRIL) 20 MG tablet  (Patient not taking: Reported on 04/24/2022)     Pertinent GI related medications were reviewed with the patient  Inpatient Medications  Current Facility-Administered Medications:    0.9 %  sodium chloride infusion, 250 mL, Intravenous, PRN, Agbata, Tochukwu, MD   acetaminophen (TYLENOL) tablet 650 mg, 650 mg, Oral, Q6H PRN **OR** acetaminophen (TYLENOL) suppository 650 mg, 650 mg, Rectal, Q6H PRN, Agbata, Tochukwu, MD   HYDROcodone-acetaminophen (NORCO/VICODIN) 5-325 MG per tablet 1 tablet, 1 tablet, Oral, Q4H PRN, Agbata, Tochukwu, MD   lisinopril (ZESTRIL) tablet 20 mg, 20 mg, Oral, Daily, Agbata, Tochukwu, MD, 20 mg at 04/24/22 1035   multivitamin with minerals tablet 1 tablet, 1 tablet, Oral, Daily, Agbata, Tochukwu, MD, 1 tablet at 04/24/22 1036   ondansetron (ZOFRAN) tablet 4 mg, 4 mg, Oral, Q6H PRN **OR** ondansetron (ZOFRAN) injection 4 mg, 4 mg, Intravenous, Q6H PRN, Agbata, Tochukwu, MD   sodium chloride flush (NS) 0.9 % injection 3 mL, 3 mL, Intravenous, Q12H, Agbata, Tochukwu, MD, 3 mL at 04/24/22 1036   sodium chloride flush (NS) 0.9 % injection 3 mL, 3 mL, Intravenous, PRN, Agbata, Tochukwu, MD  Current Outpatient Medications:    lisinopril (ZESTRIL) 40 MG tablet, Take 40 mg by mouth daily., Disp: , Rfl:    Multiple Vitamins-Minerals (CENTRUM SILVER PO), Take by mouth., Disp: , Rfl:    lisinopril (ZESTRIL) 20 MG tablet, , Disp: , Rfl:   sodium chloride      sodium chloride, acetaminophen **OR** acetaminophen, HYDROcodone-acetaminophen, ondansetron **OR** ondansetron (ZOFRAN) IV, sodium chloride flush   Objective   Vitals:   04/24/22 1000 04/24/22 1030 04/24/22 1034 04/24/22 1100  BP: (!) 181/86 (!) 170/118  (!) 175/82  Pulse: 76 72  68  Resp: 16 16  14   Temp:   98 F (36.7 C)   TempSrc:   Oral   SpO2: 95% 94%  91%     Physical Exam Vitals and nursing note reviewed.  Constitutional:      General: He is not in acute distress.     Appearance: Normal appearance. He is not ill-appearing, toxic-appearing or diaphoretic.  HENT:     Head: Normocephalic and atraumatic.     Nose: Nose normal.     Mouth/Throat:     Mouth: Mucous membranes are moist.     Pharynx: Oropharynx is clear.  Eyes:     General: Scleral icterus present.     Extraocular Movements: Extraocular movements intact.  Cardiovascular:     Rate and Rhythm: Normal rate and regular rhythm.     Heart sounds: Normal heart sounds. No murmur heard.    No friction rub. No gallop.  Pulmonary:     Effort: Pulmonary effort is normal. No respiratory distress.     Breath sounds: Normal breath sounds. No wheezing, rhonchi or rales.  Abdominal:     General: Abdomen is flat. Bowel sounds are normal. There is no distension.     Palpations: Abdomen is soft.     Tenderness: There is no abdominal tenderness. There is no guarding or rebound.  Musculoskeletal:     Cervical back: Neck supple.     Right lower  leg: No edema.     Left lower leg: No edema.  Skin:    General: Skin is warm and dry.     Coloration: Skin is jaundiced (mild). Skin is not pale.  Neurological:     General: No focal deficit present.     Mental Status: He is alert and oriented to person, place, and time. Mental status is at baseline.     Comments: No asterixis  Psychiatric:        Mood and Affect: Mood normal.        Behavior: Behavior normal.        Thought Content: Thought content normal.        Judgment: Judgment normal.     Laboratory Data Recent Labs  Lab 04/23/22 1902  WBC 11.1*  HGB 16.5  HCT 48.7  PLT 224   Recent Labs  Lab 04/23/22 1902  NA 139  K 3.1*  CL 109  CO2 21*  BUN 12  CALCIUM 8.8*  PROT 6.8  BILITOT 5.9*  ALKPHOS 176*  ALT 326*  AST 157*  GLUCOSE 125*   Recent Labs  Lab 04/24/22 0446  INR 1.0    Recent Labs    04/23/22 1902  LIPASE 27        Imaging Studies: CT ABDOMEN PELVIS W CONTRAST  Result Date: 04/24/2022 CLINICAL DATA:  Abdominal  pain and abnormal LFTs. EXAM: CT ABDOMEN AND PELVIS WITH CONTRAST TECHNIQUE: Multidetector CT imaging of the abdomen and pelvis was performed using the standard protocol following bolus administration of intravenous contrast. RADIATION DOSE REDUCTION: This exam was performed according to the departmental dose-optimization program which includes automated exposure control, adjustment of the mA and/or kV according to patient size and/or use of iterative reconstruction technique. CONTRAST:  173m OMNIPAQUE IOHEXOL 300 MG/ML  SOLN COMPARISON:  None Available. FINDINGS: Lower Chest: Normal. Hepatobiliary: Normal hepatic contours. No intra- or extrahepatic biliary dilatation. The gallbladder is normal. Pancreas: Normal pancreas. No ductal dilatation or peripancreatic fluid collection. Spleen: Normal. Adrenals/Urinary Tract: The adrenal glands are normal. There is a 10 mm stone at the left ureterovesical junction. There is no hydronephrosis. Mild left periureteral stranding. No other renal or ureteral calculi. The urinary bladder is normal for degree of distention Stomach/Bowel: There is no hiatal hernia. Normal duodenal course and caliber. No small bowel dilatation or inflammation. No focal colonic abnormality. Normal appendix. Vascular/Lymphatic: There is calcific atherosclerosis of the abdominal aorta. No lymphadenopathy. Reproductive: Enlarged prostate measures 6 cm in transverse dimension. Other: None. Musculoskeletal: No bony spinal canal stenosis or focal osseous abnormality. IMPRESSION: 1. A 10 mm stone at the left ureterovesical junction without hydronephrosis. 2. Enlarged prostate. Electronically Signed   By: KUlyses JarredM.D.   On: 04/24/2022 03:34   [place] RU[place] (LIVER/GB)  Result Date: 04/24/2022 CLINICAL DATA:  Abdominal pain, abnormal LFTs EXAM: ULTRASOUND ABDOMEN LIMITED RIGHT UPPER QUADRANT COMPARISON:  CT abdomen/pelvis dated 04/09/2014 FINDINGS: Gallbladder: Multiple gallstones measuring  up to 11 mm. No gallbladder wall thickening or pericholecystic fluid. Negative sonographic Murphy's sign. Common bile duct: Diameter: 4 mm Liver: Hyperechoic hepatic parenchyma, suggesting hepatic steatosis. No focal hepatic lesion is seen. Portal vein is patent on color Doppler imaging with normal direction of blood flow towards the liver. Other: None. IMPRESSION: Cholelithiasis, without associated sonographic findings to suggest acute cholecystitis. Suspected hepatic steatosis. Electronically Signed   By: SJulian HyM.D.   On: 04/24/2022 00:02    Assessment:   Patient Active Problem List  Diagnosis Date Noted   Ureteral stone with hydronephrosis 04/24/2022   Essential hypertension 04/24/2022   Transaminitis 04/24/2022   Kidney stone 04/24/2022    # Hepatocellular transaminitis - Rfactor 6.2 - cbd normal on ct/US; no signs of obstruction  - fatty liver disease - rare tylenol use, no use of new medications or changes in doses - no known hypotensive episodes - remote h/o colon cancer- last colonoscopy 2014 - initial labs AST 157 ALT 326 total bili 5.9 lipase 27 alk phos 176 creatinine 0.8 INR 1.0. - denies etoh and tobacco use - normal liver doppler  # phx colon cancer - 1992- intramucosal adenoca in two locations- stalked lesions in the left colon that were removed via polypectomy - no recurrence- has had 8 colonoscopies after (most recent 2014)  # h/o lgib requiring embolization -2013  Plan:  Will acquire mri mrcp to assess for obstruction and r/o liver lesions Monitor lfts and inr daily Ddx includes ischemic hepatitis, dili, liver lesion. Less likely biliary obstruction, but passed stone possiblity given ALT and AP Acquire ggt, asma, ama, viral hepatitis panel, ferritin/iron panel, tylenol and salicylates; differentiate bilirubin Ok for diet from gi perspective Supportive care at this time, pain control, anti emetics, ivf as per primary team Avoid hepatotoxic  agents  >40 minutes spent evaluating patient and chart review  I personally performed the service.  Management of other medical comorbidities as per primary team  Thank you for allowing [place] to participate in this patient's care. Please don't hesitate to call if any questions or concerns arise.   Annamaria Helling, DO [facility]  Portions of the record may have been created with voice recognition software. Occasional wrong-word or 'sound-a-like' substitutions may have occurred due to the inherent limitations of voice recognition software.  Read the chart carefully and recognize, using context, where substitutions may have occurred.

## 2022-04-25 ENCOUNTER — Encounter: Payer: Self-pay | Admitting: Internal Medicine

## 2022-04-25 ENCOUNTER — Inpatient Hospital Stay: Payer: Medicare HMO | Admitting: Certified Registered"

## 2022-04-25 ENCOUNTER — Inpatient Hospital Stay: Payer: Medicare HMO

## 2022-04-25 ENCOUNTER — Encounter
Admission: EM | Disposition: A | Discharge status: Home / Self Care | Payer: Self-pay | Source: Home / Self Care | Attending: Internal Medicine

## 2022-04-25 DIAGNOSIS — N210 Calculus in bladder: Secondary | ICD-10-CM

## 2022-04-25 DIAGNOSIS — K8020 Calculus of gallbladder without cholecystitis without obstruction: Secondary | ICD-10-CM

## 2022-04-25 DIAGNOSIS — N132 Hydronephrosis with renal and ureteral calculous obstruction: Secondary | ICD-10-CM | POA: Diagnosis not present

## 2022-04-25 HISTORY — PX: CYSTOSCOPY/URETEROSCOPY/HOLMIUM LASER/STENT PLACEMENT: SHX6546

## 2022-04-25 LAB — CBC
HCT: 50.4 % (ref 39.0–52.0)
Hemoglobin: 17.4 g/dL — ABNORMAL HIGH (ref 13.0–17.0)
MCH: 29.8 pg (ref 26.0–34.0)
MCHC: 34.5 g/dL (ref 30.0–36.0)
MCV: 86.3 fL (ref 80.0–100.0)
Platelets: 250 10*3/uL (ref 150–400)
RBC: 5.84 MIL/uL — ABNORMAL HIGH (ref 4.22–5.81)
RDW: 14.5 % (ref 11.5–15.5)
WBC: 10.9 10*3/uL — ABNORMAL HIGH (ref 4.0–10.5)
nRBC: 0 % (ref 0.0–0.2)

## 2022-04-25 LAB — URINE CULTURE: Culture: NO GROWTH

## 2022-04-25 LAB — ANTI-SMOOTH MUSCLE ANTIBODY, IGG: F-Actin IgG: 17 Units (ref 0–19)

## 2022-04-25 LAB — COMPREHENSIVE METABOLIC PANEL
ALT: 231 U/L — ABNORMAL HIGH (ref 0–44)
AST: 75 U/L — ABNORMAL HIGH (ref 15–41)
Albumin: 4.2 g/dL (ref 3.5–5.0)
Alkaline Phosphatase: 200 U/L — ABNORMAL HIGH (ref 38–126)
Anion gap: 11 (ref 5–15)
BUN: 18 mg/dL (ref 8–23)
CO2: 22 mmol/L (ref 22–32)
Calcium: 9.2 mg/dL (ref 8.9–10.3)
Chloride: 107 mmol/L (ref 98–111)
Creatinine, Ser: 1.05 mg/dL (ref 0.61–1.24)
GFR, Estimated: >60 mL/min (ref >60)
Glucose, Bld: 100 mg/dL — ABNORMAL HIGH (ref 70–99)
Potassium: 3.5 mmol/L (ref 3.5–5.1)
Sodium: 140 mmol/L (ref 135–145)
Total Bilirubin: 3.4 mg/dL — ABNORMAL HIGH (ref 0.3–1.2)
Total Protein: 7.4 g/dL (ref 6.5–8.1)

## 2022-04-25 LAB — MITOCHONDRIAL ANTIBODIES: Mitochondrial M2 Ab, IgG: <20 Units (ref 0.0–20.0)

## 2022-04-25 SURGERY — CYSTOSCOPY/URETEROSCOPY/HOLMIUM LASER/STENT PLACEMENT
Anesthesia: General | Site: Bladder | Laterality: Left

## 2022-04-25 MED ORDER — FENTANYL CITRATE (PF) 100 MCG/2ML IJ SOLN
25.0000 ug | INTRAMUSCULAR | Status: DC | PRN
  Administered 2022-04-25 (×3): 25 ug via INTRAVENOUS

## 2022-04-25 MED ORDER — ACETAMINOPHEN 10 MG/ML IV SOLN
1000.0000 mg | Freq: Once | INTRAVENOUS | Status: DC | PRN
Start: 2022-04-25 — End: 2022-04-25

## 2022-04-25 MED ORDER — FENTANYL CITRATE (PF) 100 MCG/2ML IJ SOLN
INTRAMUSCULAR | Status: AC
  Filled 2022-04-25: qty 2

## 2022-04-25 MED ORDER — ACETAMINOPHEN 10 MG/ML IV SOLN
INTRAVENOUS | Status: AC
  Filled 2022-04-25: qty 100

## 2022-04-25 MED ORDER — ONDANSETRON HCL 4 MG/2ML IJ SOLN
INTRAMUSCULAR | Status: DC | PRN
  Administered 2022-04-25 (×2): 4 mg via INTRAVENOUS

## 2022-04-25 MED ORDER — MIDAZOLAM HCL 2 MG/2ML IJ SOLN
INTRAMUSCULAR | Status: AC
  Filled 2022-04-25: qty 2

## 2022-04-25 MED ORDER — FENTANYL CITRATE (PF) 100 MCG/2ML IJ SOLN
INTRAMUSCULAR | Status: AC
  Administered 2022-04-25: 25 ug via INTRAVENOUS
  Filled 2022-04-25: qty 2

## 2022-04-25 MED ORDER — PROPOFOL 10 MG/ML IV BOLUS
INTRAVENOUS | Status: DC | PRN
  Administered 2022-04-25: 80 mg via INTRAVENOUS

## 2022-04-25 MED ORDER — GLYCOPYRROLATE 0.2 MG/ML IJ SOLN
INTRAMUSCULAR | Status: DC | PRN
  Administered 2022-04-25: .1 mg via INTRAVENOUS

## 2022-04-25 MED ORDER — CHLORHEXIDINE GLUCONATE CLOTH 2 % EX PADS
6.0000 | MEDICATED_PAD | Freq: Every day | CUTANEOUS | Status: DC
Start: 2022-04-26 — End: 2022-04-26
  Administered 2022-04-26: 6 via TOPICAL

## 2022-04-25 MED ORDER — OXYCODONE HCL 5 MG/5ML PO SOLN: 5.0000 mg | Freq: Once | ORAL | Status: DC | PRN

## 2022-04-25 MED ORDER — ACETAMINOPHEN 10 MG/ML IV SOLN
INTRAVENOUS | Status: DC | PRN
  Administered 2022-04-25: 1000 mg via INTRAVENOUS

## 2022-04-25 MED ORDER — SUGAMMADEX SODIUM 200 MG/2ML IV SOLN
INTRAVENOUS | Status: DC | PRN
  Administered 2022-04-25: 300 mg via INTRAVENOUS

## 2022-04-25 MED ORDER — DEXMEDETOMIDINE HCL IN NACL 200 MCG/50ML IV SOLN
INTRAVENOUS | Status: DC | PRN
  Administered 2022-04-25 (×2): 8 ug via INTRAVENOUS

## 2022-04-25 MED ORDER — OXYCODONE HCL 5 MG PO TABS: 5.0000 mg | ORAL_TABLET | Freq: Once | ORAL | Status: DC | PRN

## 2022-04-25 MED ORDER — CHLORHEXIDINE GLUCONATE CLOTH 2 % EX PADS: 6.0000 | MEDICATED_PAD | Freq: Every day | CUTANEOUS | Status: DC

## 2022-04-25 MED ORDER — SODIUM CHLORIDE 0.9 % IR SOLN
Status: DC | PRN
  Administered 2022-04-25 (×2): 3000 mL via INTRAVESICAL

## 2022-04-25 MED ORDER — IOHEXOL 180 MG/ML  SOLN
INTRAMUSCULAR | Status: DC | PRN
  Administered 2022-04-25: 20 mL

## 2022-04-25 MED ORDER — ONDANSETRON HCL 4 MG/2ML IJ SOLN
4.0000 mg | Freq: Once | INTRAMUSCULAR | Status: AC | PRN
Start: 2022-04-25 — End: 2022-04-25
  Administered 2022-04-25: 4 mg via INTRAVENOUS

## 2022-04-25 MED ORDER — FENTANYL CITRATE (PF) 100 MCG/2ML IJ SOLN
INTRAMUSCULAR | Status: DC | PRN
Start: 2022-04-25 — End: 2022-04-25
  Administered 2022-04-25: 50 ug via INTRAVENOUS

## 2022-04-25 MED ORDER — HYDRALAZINE HCL 20 MG/ML IJ SOLN
10.0000 mg | Freq: Four times a day (QID) | INTRAMUSCULAR | Status: DC | PRN
Start: 2022-04-25 — End: 2022-04-26

## 2022-04-25 MED ORDER — LIDOCAINE HCL (CARDIAC) PF 100 MG/5ML IV SOSY
PREFILLED_SYRINGE | INTRAVENOUS | Status: DC | PRN
  Administered 2022-04-25: 100 mg via INTRAVENOUS

## 2022-04-25 MED ORDER — ONDANSETRON HCL 4 MG/2ML IJ SOLN
INTRAMUSCULAR | Status: AC
  Filled 2022-04-25: qty 2

## 2022-04-25 MED ORDER — DEXAMETHASONE SODIUM PHOSPHATE 10 MG/ML IJ SOLN
INTRAMUSCULAR | Status: DC | PRN
  Administered 2022-04-25: 5 mg via INTRAVENOUS

## 2022-04-25 MED ORDER — ROCURONIUM BROMIDE 100 MG/10ML IV SOLN
INTRAVENOUS | Status: DC | PRN
  Administered 2022-04-25: 50 mg via INTRAVENOUS

## 2022-04-25 SURGICAL SUPPLY — 28 items
BAG DRAIN CYSTO-URO LG1000N (MISCELLANEOUS) ×2 IMPLANT
BASKET ZERO TIP 1.9FR (BASKET) IMPLANT
BRUSH SCRUB EZ 1% IODOPHOR (MISCELLANEOUS) ×2 IMPLANT
CATH COUDE FOLEY 2W 5CC 18FR (CATHETERS) ×1 IMPLANT
CATH URET FLEX-TIP 2 LUMEN 10F (CATHETERS) IMPLANT
CATH URETL OPEN END 6X70 (CATHETERS) ×1 IMPLANT
CNTNR SPEC 2.5X3XGRAD LEK (MISCELLANEOUS)
CONT SPEC 4OZ STER OR WHT (MISCELLANEOUS)
CONTAINER SPEC 2.5X3XGRAD LEK (MISCELLANEOUS) IMPLANT
DRAPE UTILITY 15X26 TOWEL STRL (DRAPES) ×2 IMPLANT
FIBER LASER MOSES 200 DFL (Laser) ×1 IMPLANT
GLOVE SURG UNDER POLY LF SZ7.5 (GLOVE) ×2 IMPLANT
GOWN STRL REUS W/ TWL LRG LVL3 (GOWN DISPOSABLE) ×1 IMPLANT
GOWN STRL REUS W/ TWL XL LVL3 (GOWN DISPOSABLE) ×1 IMPLANT
GOWN STRL REUS W/TWL LRG LVL3 (GOWN DISPOSABLE) ×1
GOWN STRL REUS W/TWL XL LVL3 (GOWN DISPOSABLE) ×1
GUIDEWIRE STR DUAL SENSOR (WIRE) ×2 IMPLANT
IV NS IRRIG 3000ML ARTHROMATIC (IV SOLUTION) ×3 IMPLANT
KIT TURNOVER CYSTO (KITS) ×2 IMPLANT
PACK CYSTO AR (MISCELLANEOUS) ×2 IMPLANT
SET CYSTO W/LG BORE CLAMP LF (SET/KITS/TRAYS/PACK) ×2 IMPLANT
SHEATH NAVIGATOR HD 12/14X36 (SHEATH) IMPLANT
STENT URET 6FRX24 CONTOUR (STENTS) IMPLANT
STENT URET 6FRX26 CONTOUR (STENTS) IMPLANT
SURGILUBE 2OZ TUBE FLIPTOP (MISCELLANEOUS) ×2 IMPLANT
TRACTIP FLEXIVA PULSE ID 200 (Laser) ×2 IMPLANT
VALVE UROSEAL ADJ ENDO (VALVE) IMPLANT
WATER STERILE IRR 500ML POUR (IV SOLUTION) ×2 IMPLANT

## 2022-04-25 NOTE — Anesthesia Preprocedure Evaluation (Signed)
Anesthesia Evaluation  Patient identified by MRN, date of birth, ID band Patient awake    Reviewed: Allergy & Precautions, NPO status , Patient's Chart, lab work & pertinent test results  History of Anesthesia Complications Negative for: history of anesthetic complications  Airway Mallampati: II  TM Distance: >3 FB Neck ROM: Full    Dental  (+) Upper Dentures, Lower Dentures   Pulmonary neg pulmonary ROS, neg sleep apnea, neg COPD, Patient abstained from smoking.Not current smoker,    Pulmonary exam normal breath sounds clear to auscultation       Cardiovascular Exercise Tolerance: Good METShypertension, Pt. on medications (-) CAD and (-) Past MI (-) dysrhythmias  Rhythm:Regular Rate:Normal - Systolic murmurs    Neuro/Psych negative neurological ROS  negative psych ROS   GI/Hepatic neg GERD  ,(+)     (-) substance abuse  , Hepatitis -, Unspecified  Endo/Other  neg diabetes  Renal/GU Renal disease     Musculoskeletal   Abdominal   Peds  Hematology   Anesthesia Other Findings Past Medical History: No date: Actinic keratosis 12/20/2015: Basal cell carcinoma     Comment:  R nasal tip, R nasolabial  01/12/2020: Basal cell carcinoma     Comment:  L upper nasal dorsum (clear w/bx), recurrent bx/edc               05/02/2021 01/12/2020: Basal cell carcinoma     Comment:  Lower sternum, [facility] 04/05/20  02/26/2019: Basal cell carcinoma     Comment:  L post auricular neck  08/02/2021: BCC (basal cell carcinoma of skin)     Comment:  R glabella, EDC No date: Hypertension  Reproductive/Obstetrics                             Anesthesia Physical Anesthesia Plan  ASA: 2  Anesthesia Plan: General   Post-op Pain Management: Ofirmev IV (intra-op)*   Induction: Intravenous  PONV Risk Score and Plan: 3 and Ondansetron, Dexamethasone and Treatment may vary due to age or medical condition  Airway  Management Planned: Oral ETT  Additional Equipment: None  Intra-op Plan:   Post-operative Plan: Extubation in OR  Informed Consent: I have reviewed the patients History and Physical, chart, labs and discussed the procedure including the risks, benefits and alternatives for the proposed anesthesia with the patient or authorized representative who has indicated his/her understanding and acceptance.     Dental advisory given  Plan Discussed with: CRNA and Surgeon  Anesthesia Plan Comments: (Discussed risks of anesthesia with patient, including PONV, sore throat, lip/dental/eye damage. Rare risks discussed as well, such as cardiorespiratory and neurological sequelae, and allergic reactions. Discussed the role of CRNA in patient's perioperative care. Patient understands. Denies N/V, NPO appropriate)        Anesthesia Quick Evaluation

## 2022-04-25 NOTE — Op Note (Signed)
Preoperative diagnosis:  Left distal ureteral calculus  Postoperative diagnosis:  Bladder calculus (< 2.5 cm)  Procedure: Cystoscopy with left retrograde pyelogram Cystolitholapaxy  Surgeon: Abbie Sons, MD  Anesthesia: General  Complications: None  Intraoperative findings:  Cystoscopy-small urethral meatus which required dilation with Leander Rams sounds.  Remainder of the urethra normal in caliber without stricture.  Prominent lateral lobe enlargement with moderate median lobe.  Moderate bladder trabeculation without erythema, solid or papillary lesions.  Spiculated calculus noted in the dependent portion of the bladder.  UOs normal-appearing bilaterally. Left retrograde pyelogram-normal appearing ureter without dilation, filling defect.  Collecting system normal in appearance  EBL: Minimal  Specimens: None  Indication: Caleb Maldonado is a 86 y.o. admitted with abdominal pain, elevated liver function test and abdominal/pelvic CT felt to show a 10 mm nonobstructing left distal ureteral calculus.  On my review of CT difficult to discern if calculus in the bladder or ureter.  After reviewing the management options for treatment, he elected to proceed with the above surgical procedure(s). We have discussed the potential benefits and risks of the procedure, side effects of the proposed treatment, the likelihood of the patient achieving the goals of the procedure, and any potential problems that might occur during the procedure or recuperation. Informed consent has been obtained.  Description of procedure:  The patient was taken to the operating room and general anesthesia was induced.  The patient was placed in the dorsal lithotomy position, prepped and draped in the usual sterile fashion, and preoperative antibiotics were administered. A preoperative time-out was performed.   The urethral meatus would not accept a 21 French cystoscope sheath and was dilated with [employer] sounds from  10-26 [place].  The cystoscope was then easily inserted and advanced proximally under direct vision with findings as described above.  A 0.038 Sensor wire was placed through the cystoscope and into the left ureteral orifice.  A 6 French open-ended catheter was advanced over the wire into the distal ureter.  The guidewire was removed and retrograde pyelogram was performed with findings as described above.  A 242 m holmium laser fiber was then placed through the ureteral catheter in the bladder calculus was fragmented at a setting of 0.8J/10 Hz and subsequently increased to 1.0J/15 Hz.  All fragments were removed via irrigation.  At procedure completion no additional fragments were identified.  There was oozing from the prostatic urethra and an 55 French coud catheter was placed to gravity drainage with return of pink-tinged effluent upon irrigation.  After anesthetic reversal he was transported to the PACU in stable condition.   Plan: 1.  Remove Foley catheter in a.m.    Abbie Sons, M.D.

## 2022-04-25 NOTE — Anesthesia Procedure Notes (Signed)
Procedure Name: Intubation Date/Time: 04/25/2022 10:47 AM  Performed by: Kelton Pillar, CRNAPre-anesthesia Checklist: Patient identified, Emergency Drugs available, Suction available and Patient being monitored Patient Re-evaluated:Patient Re-evaluated prior to induction Oxygen Delivery Method: Circle system utilized Preoxygenation: Pre-oxygenation with 100% oxygen Induction Type: IV induction Ventilation: Mask ventilation without difficulty Laryngoscope Size: McGraph and 3 Grade View: Grade I Tube type: Oral Tube size: 7.0 mm Number of attempts: 1 Airway Equipment and Method: Stylet and Oral airway Placement Confirmation: ETT inserted through vocal cords under direct vision, positive ETCO2, breath sounds checked- equal and bilateral and CO2 detector Secured at: 21 cm Tube secured with: Tape Dental Injury: Teeth and Oropharynx as per pre-operative assessment

## 2022-04-25 NOTE — Progress Notes (Addendum)
PROGRESS NOTE    Caleb Maldonado  ZLD:[id number] DOB: 09-18-1936 DOA: 04/24/2022 PCP: [name], MD    Brief Narrative:  Caleb Maldonado is a 86 y.o. male with medical history significant for hypertension, prior history of GI bleed, nephrolithiasis, basal cell carcinoma who presents to the ER for evaluation of intermittent episodes of lower abdominal pain over the last 1 month. He presented to the ER for evaluation due to the severity of the episode he had on that day and the prolonged duration.  He rated the pain an 8 x 10 in intensity at its worst in both lower quadrants and nonradiating.  Pain was associated with some dark-colored urine but he denied having any fever, no chills, no hematuria.  He states that the pain resolved a couple of hours later and during my evaluation he denied having any pain. He denies having any chest pain, no shortness of breath, no headache, no dizziness, no lightheadedness, no changes in his bowel habits, no cough, no leg swelling, no palpitations or diaphoresis. He had a renal stone CT which showed a 10 mm stone at the left ureterovesical junction without hydronephrosis.  Enlarged prostate. He will be admitted to the hospital for further evaluation.  7/25 s/p cystoscopy  Consultants:  surgery, urology, GI  Procedures:  MRI/MRCP 1. Multiple large gallstones within lumen the gallbladder. No evidence acute cholecystitis. 2. No choledocholithiasis.  No biliary duct dilatation 3. No pancreatic inflammation or duct dilatation. Normal pancreatic ductal anatomy. Mild benign appearing side branch ductal ectasia. 4. Benign Bosniak 1 cyst of the RIGHT kidney. No follow-up recommended  Abd [place] Cholelithiasis, without associated sonographic findings to suggest acute cholecystitis.  Suspected hepatic steatosis  CT scan 1. A 10 mm stone at the left ureterovesical junction without hydronephrosis. 2. Enlarged prostate.    Antimicrobials:   Rocephin   Subjective: Patient sleepy as he just got back from the OR.  Falls asleep but opens his eyes when I call his name.  Objective: Vitals:   04/25/22 1255 04/25/22 1300 04/25/22 1315 04/25/22 1345  BP: (!) 154/84  (!) 168/81 (!) 144/61  Pulse: 76 (!) 52 76 71  Resp: '20 19 16 16  '$ Temp:   (!) 90.3 F (36.4 C) (!) 97.5 F (36.4 C)  TempSrc:    Axillary  SpO2: 92% 93% 91% 92%  Weight:      Height:        Intake/Output Summary (Last 24 hours) at 04/25/2022 1433 Last data filed at 04/25/2022 1230 Gross per 24 hour  Intake 620 ml  Output 476 ml  Net 144 ml   Filed Weights   04/25/22 0953  Weight: 86.2 kg    Examination:  Calm, NAD Cta no w/r Reg s1/s2 no gallop Soft benign +bs No edema Sleepy unable to assess Mood and affect appropriate in current setting     Data Reviewed: I have personally reviewed following labs and imaging studies  CBC: Recent Labs  Lab 04/23/22 1902 04/25/22 0505  WBC 11.1* 10.9*  HGB 16.5 17.4*  HCT 48.7 50.4  MCV 87.1 86.3  PLT 224 009   Basic Metabolic Panel: Recent Labs  Lab 04/23/22 1902 04/23/22 2203 04/25/22 0505  NA 139  --  140  K 3.1*  --  3.5  CL 109  --  107  CO2 21*  --  22  GLUCOSE 125*  --  100*  BUN 12  --  18  CREATININE 0.80  --  1.05  CALCIUM  8.8*  --  9.2  MG  --  2.2  --    GFR: Estimated Creatinine Clearance: 57.5 mL/min (by C-G formula based on SCr of 1.05 mg/dL). Liver Function Tests: Recent Labs  Lab 04/23/22 1902 04/24/22 1242 04/25/22 0505  AST 157*  --  75*  ALT 326*  --  231*  ALKPHOS 176*  --  200*  BILITOT 5.9* 3.8* 3.4*  PROT 6.8  --  7.4  ALBUMIN 3.7  --  4.2   Recent Labs  Lab 04/23/22 1902  LIPASE 27   No results for input(s): "AMMONIA" in the last 168 hours. Coagulation Profile: Recent Labs  Lab 04/24/22 0446  INR 1.0   Cardiac Enzymes: No results for input(s): "CKTOTAL", "CKMB", "CKMBINDEX", "TROPONINI" in the last 168 hours. BNP (last 3 results) No  results for input(s): "PROBNP" in the last 8760 hours. HbA1C: No results for input(s): "HGBA1C" in the last 72 hours. CBG: No results for input(s): "GLUCAP" in the last 168 hours. Lipid Profile: No results for input(s): "CHOL", "HDL", "LDLCALC", "TRIG", "CHOLHDL", "LDLDIRECT" in the last 72 hours. Thyroid Function Tests: No results for input(s): "TSH", "T4TOTAL", "FREET4", "T3FREE", "THYROIDAB" in the last 72 hours. Anemia Panel: Recent Labs    04/24/22 1242  FERRITIN 330  TIBC 249*  IRON 118   Sepsis Labs: No results for input(s): "PROCALCITON", "LATICACIDVEN" in the last 168 hours.  No results found for this or any previous visit (from the past 240 hour(s)).       Radiology Studies: DG OR UROLOGY CYSTO IMAGE ([facility] ONLY)  Result Date: 04/25/2022 There is no interpretation for this exam.  This order is for images obtained during a surgical procedure.  Please See "Surgeries" Tab for more information regarding the procedure.   MR ABDOMEN MRCP W WO CONTAST  Result Date: 04/24/2022 CLINICAL DATA:  Abnormal LFTs. Concern for choledocholithiasis. Rule out liver lesion. EXAM: MRI ABDOMEN WITHOUT AND WITH CONTRAST (INCLUDING MRCP) TECHNIQUE: Multiplanar multisequence MR imaging of the abdomen was performed both before and after the administration of intravenous contrast. Heavily T2-weighted images of the biliary and pancreatic ducts were obtained, and three-dimensional MRCP images were rendered by post processing. CONTRAST:  52m GADAVIST GADOBUTROL 1 MMOL/ML IV SOLN COMPARISON:  None Available. FINDINGS: Lower chest:  Lung bases are clear. Hepatobiliary: Multiple large gallstones fill the lumen of the gallbladder. Stones range in size from 8 mm to 28 mm. No gallbladder wall thickening or pericholecystic fluid. The common bile duct is normal caliber measuring 5 mm. No filling defect the common bile duct. No intrahepatic biliary duct dilatation. There several small benign cystic lesion within  the LEFT and RIGHT hepatic lobe. Pancreas: Pancreatic duct is normal caliber. No variant ductal anatomy identified. Normal pancreas parenchyma. No peripancreatic fluid collections or significant inflammation. Small cystic lesion measuring 5 mm in the mid body pancreas supra 19/4. Mild ductal ectasia in the mid body of the pancreas (image 8/series 17). Spleen: Normal spleen. Adrenals/urinary tract: Adrenal glands normal. Benign nonenhancing cyst the upper pole of the RIGHT kidney. Stomach/Bowel: Stomach and limited of the small bowel is unremarkable Vascular/Lymphatic: Abdominal aortic normal caliber. No retroperitoneal periportal lymphadenopathy. Musculoskeletal: No aggressive osseous lesion IMPRESSION: 1. Multiple large gallstones within lumen the gallbladder. No evidence acute cholecystitis. 2. No choledocholithiasis.  No biliary duct dilatation 3. No pancreatic inflammation or duct dilatation. Normal pancreatic ductal anatomy. Mild benign appearing side branch ductal ectasia. 4. Benign Bosniak 1 cyst of the RIGHT kidney. No follow-up recommended Electronically Signed  By: Suzy Bouchard M.D.   On: 04/24/2022 17:31   MR 3D Recon At Scanner  Result Date: 04/24/2022 CLINICAL DATA:  Abnormal LFTs. Concern for choledocholithiasis. Rule out liver lesion. EXAM: MRI ABDOMEN WITHOUT AND WITH CONTRAST (INCLUDING MRCP) TECHNIQUE: Multiplanar multisequence MR imaging of the abdomen was performed both before and after the administration of intravenous contrast. Heavily T2-weighted images of the biliary and pancreatic ducts were obtained, and three-dimensional MRCP images were rendered by post processing. CONTRAST:  46m GADAVIST GADOBUTROL 1 MMOL/ML IV SOLN COMPARISON:  None Available. FINDINGS: Lower chest:  Lung bases are clear. Hepatobiliary: Multiple large gallstones fill the lumen of the gallbladder. Stones range in size from 8 mm to 28 mm. No gallbladder wall thickening or pericholecystic fluid. The common bile  duct is normal caliber measuring 5 mm. No filling defect the common bile duct. No intrahepatic biliary duct dilatation. There several small benign cystic lesion within the LEFT and RIGHT hepatic lobe. Pancreas: Pancreatic duct is normal caliber. No variant ductal anatomy identified. Normal pancreas parenchyma. No peripancreatic fluid collections or significant inflammation. Small cystic lesion measuring 5 mm in the mid body pancreas supra 19/4. Mild ductal ectasia in the mid body of the pancreas (image 8/series 17). Spleen: Normal spleen. Adrenals/urinary tract: Adrenal glands normal. Benign nonenhancing cyst the upper pole of the RIGHT kidney. Stomach/Bowel: Stomach and limited of the small bowel is unremarkable Vascular/Lymphatic: Abdominal aortic normal caliber. No retroperitoneal periportal lymphadenopathy. Musculoskeletal: No aggressive osseous lesion IMPRESSION: 1. Multiple large gallstones within lumen the gallbladder. No evidence acute cholecystitis. 2. No choledocholithiasis.  No biliary duct dilatation 3. No pancreatic inflammation or duct dilatation. Normal pancreatic ductal anatomy. Mild benign appearing side branch ductal ectasia. 4. Benign Bosniak 1 cyst of the RIGHT kidney. No follow-up recommended Electronically Signed   By: SSuzy BouchardM.D.   On: 04/24/2022 17:31   CT ABDOMEN PELVIS W CONTRAST  Result Date: 04/24/2022 CLINICAL DATA:  Abdominal pain and abnormal LFTs. EXAM: CT ABDOMEN AND PELVIS WITH CONTRAST TECHNIQUE: Multidetector CT imaging of the abdomen and pelvis was performed using the standard protocol following bolus administration of intravenous contrast. RADIATION DOSE REDUCTION: This exam was performed according to the departmental dose-optimization program which includes automated exposure control, adjustment of the mA and/or kV according to patient size and/or use of iterative reconstruction technique. CONTRAST:  1026mOMNIPAQUE IOHEXOL 300 MG/ML  SOLN COMPARISON:  None  Available. FINDINGS: Lower Chest: Normal. Hepatobiliary: Normal hepatic contours. No intra- or extrahepatic biliary dilatation. The gallbladder is normal. Pancreas: Normal pancreas. No ductal dilatation or peripancreatic fluid collection. Spleen: Normal. Adrenals/Urinary Tract: The adrenal glands are normal. There is a 10 mm stone at the left ureterovesical junction. There is no hydronephrosis. Mild left periureteral stranding. No other renal or ureteral calculi. The urinary bladder is normal for degree of distention Stomach/Bowel: There is no hiatal hernia. Normal duodenal course and caliber. No small bowel dilatation or inflammation. No focal colonic abnormality. Normal appendix. Vascular/Lymphatic: There is calcific atherosclerosis of the abdominal aorta. No lymphadenopathy. Reproductive: Enlarged prostate measures 6 cm in transverse dimension. Other: None. Musculoskeletal: No bony spinal canal stenosis or focal osseous abnormality. IMPRESSION: 1. A 10 mm stone at the left ureterovesical junction without hydronephrosis. 2. Enlarged prostate. Electronically Signed   By: KeUlyses Jarred.D.   On: 04/24/2022 03:34   [place] LIMITED RUQ (LIVER/GB)  Result Date: 04/24/2022 CLINICAL DATA:  Abdominal pain, abnormal LFTs EXAM: ULTRASOUND ABDOMEN LIMITED RIGHT UPPER QUADRANT COMPARISON:  CT abdomen/pelvis dated  04/09/2014 FINDINGS: Gallbladder: Multiple gallstones measuring up to 11 mm. No gallbladder wall thickening or pericholecystic fluid. Negative sonographic Murphy's sign. Common bile duct: Diameter: 4 mm Liver: Hyperechoic hepatic parenchyma, suggesting hepatic steatosis. No focal hepatic lesion is seen. Portal vein is patent on color Doppler imaging with normal direction of blood flow towards the liver. Other: None. IMPRESSION: Cholelithiasis, without associated sonographic findings to suggest acute cholecystitis. Suspected hepatic steatosis. Electronically Signed   By: Julian Hy M.D.   On: 04/24/2022  00:02        Scheduled Meds:  [START ON 04/26/2022] Chlorhexidine Gluconate Cloth  6 each Topical Q0600   lisinopril  20 mg Oral Daily   multivitamin with minerals  1 tablet Oral Daily   ondansetron       sodium chloride flush  3 mL Intravenous Q12H   Continuous Infusions:  sodium chloride     cefTRIAXone (ROCEPHIN)  IV      Assessment & Plan:   Principal Problem:   Ureteral stone with hydronephrosis Active Problems:   Essential hypertension   Transaminitis   Kidney stone   Cholelithiases   Ureteral stone with hydronephrosis Urology consulted Status post cystoscopy today Continue IV antibiotics Pain control   Transaminitis Cholelithiasis Patient noted to have marked transaminitis Gallbladder ultrasound shows cholelithiasis, without associated sonographic findings to suggest acute cholecystitis as well as hepatic steatosis. 7/25 surgery and GI consulted Gi input appreciated: Possibly due to gallstone versus hypoperfusion episode leading to ischemic hepatitis Salicylate level and acetaminophen levels low Ultrasound no signs of obstruction MRI/MRCP result above Monitor levels F/u surgical consult    Essential hypertension Continue lisinopril Hydralazine prn     DVT prophylaxis: SCD Code Status: Full Family Communication: Family at bedside Disposition Plan:  Status is: Inpatient Remains inpatient appropriate because:i V treatment        LOS: 1 day   Time spent: 35 minutes    Nolberto Hanlon, MD [facility] Pager [phone]  If 7PM-7AM, please contact night-coverage 04/25/2022, 2:33 PM

## 2022-04-25 NOTE — TOC Initial Note (Signed)
Transition of Care [facility]) - Initial/Assessment Note    Patient Details  Name: Caleb Maldonado MRN: [id number] Date of Birth: Aug 25, 1936  Transition of Care [facility]) CM/SW Contact:    Beverly Sessions, RN Phone Number: 04/25/2022, 3:34 PM  Clinical Narrative:                       Transition of Care [facility]) Screening Note   Patient Details  Name: Caleb Maldonado Date of Birth: 04/11/1936   Transition of Care [facility]) CM/SW Contact:    Beverly Sessions, RN Phone Number: 04/25/2022, 3:35 PM    Transition of Care Department [facility]) has reviewed patient and no TOC needs have been identified at this time. We will continue to monitor patient advancement through interdisciplinary progression rounds. If new patient transition needs arise, please place a TOC consult.     Patient Goals and CMS Choice        Expected Discharge Plan and Services                                                Prior Living Arrangements/Services                       Activities of Daily Living Home Assistive Devices/Equipment: None ADL Screening (condition at time of admission) Patient's cognitive ability adequate to safely complete daily activities?: Yes Is the patient deaf or have difficulty hearing?: No Does the patient have difficulty seeing, even when wearing glasses/contacts?: No Does the patient have difficulty concentrating, remembering, or making decisions?: No Patient able to express need for assistance with ADLs?: Yes Does the patient have difficulty dressing or bathing?: No Independently performs ADLs?: Yes (appropriate for developmental age) Does the patient have difficulty walking or climbing stairs?: No Weakness of Legs: None Weakness of Arms/Hands: None  Permission Sought/Granted                  Emotional Assessment              Admission diagnosis:  Hypokalemia [E87.6] Kidney stone [N20.0] Nephrolithiasis [N20.0] Transaminitis  [R74.01] Frequent PVCs [I49.3] Ureteral stone with hydronephrosis [N13.2] Patient Active Problem List   Diagnosis Date Noted   Cholelithiases 04/25/2022   Ureteral stone with hydronephrosis 04/24/2022   Essential hypertension 04/24/2022   Transaminitis 04/24/2022   Kidney stone 04/24/2022   PCP:  [name], MD Pharmacy:  No Pharmacies Listed    Social Determinants of Health (SDOH) Interventions    Readmission Risk Interventions     No data to display

## 2022-04-25 NOTE — Progress Notes (Signed)
Patient returned from surgery

## 2022-04-25 NOTE — Consult Note (Signed)
SURGICAL CONSULTATION NOTE   HISTORY OF PRESENT ILLNESS (HPI):  86 y.o. male presented to [facility] ED for evaluation of lower abdominal pain. Patient reports he has been having lower abdominal pain for the last month.  Other than lower abdominal pain he can identify if it is more on the right or the left side.  Denies any pain radiation.  Cannot identify any alleviating or grading factors.  At the ED he was found with a 2.5 cm stone in the left ureter.  Today he had cystoscopy with left retrograde pyelogram and cystolitholapaxy.  He was found with incidental gallstones on ultrasound.  He also was found with transaminitis.  Patient had an MRCP that shows normal common bile duct and no complication from the gallstones.  I personally evaluated the images of the MRCP.  He was evaluated by GI who said that transaminitis could have been ischemia versus passing a stone but less likely passing a stone due to the normal common bile duct.  Surgery is consulted by Dr. Kurtis Bushman in this context for evaluation and management of transaminitis.  PAST MEDICAL HISTORY (PMH):  Past Medical History:  Diagnosis Date   Actinic keratosis    Basal cell carcinoma 12/20/2015   R nasal tip, R nasolabial    Basal cell carcinoma 01/12/2020   L upper nasal dorsum (clear w/bx), recurrent bx/edc 05/02/2021   Basal cell carcinoma 01/12/2020   Lower sternum, EDC 04/05/20    Basal cell carcinoma 02/26/2019   L post auricular neck    BCC (basal cell carcinoma of skin) 08/02/2021   R glabella, EDC   Hypertension      PAST SURGICAL HISTORY ([place]):  Past Surgical History:  Procedure Laterality Date   CYSTOSCOPY/URETEROSCOPY/HOLMIUM LASER/STENT PLACEMENT Left 04/25/2022   Procedure: CYSTOSCOPY/HOLMIUM LASER/ RETORGRADE PYLEOGRAM/ LITHOLAPLAXY;  Surgeon: Abbie Sons, MD;  Location: [facility] ORS;  Service: Urology;  Laterality: Left;   POLYPECTOMY  1992     MEDICATIONS:  Prior to Admission medications   Medication Sig Start Date  End Date Taking? Authorizing Provider  lisinopril (ZESTRIL) 40 MG tablet Take 40 mg by mouth daily. 02/21/22  Yes [provider]  Multiple Vitamins-Minerals (CENTRUM SILVER PO) Take by mouth.   Yes [provider]  lisinopril (ZESTRIL) 20 MG tablet  07/04/19   [provider]     ALLERGIES:  No Known Allergies   SOCIAL HISTORY:  Social History   Socioeconomic History   Marital status: Married    Spouse name: Not on file   Number of children: Not on file   Years of education: Not on file   Highest education level: Not on file  Occupational History   Not on file  Tobacco Use   Smoking status: Never   Smokeless tobacco: Never  Vaping Use   Vaping Use: Never used  Substance and Sexual Activity   Alcohol use: Not on file   Drug use: Not on file   Sexual activity: Not on file  Other Topics Concern   Not on file  Social History Narrative   Not on file   Social Determinants of Health   Financial Resource Strain: Not on file  Food Insecurity: Not on file  Transportation Needs: Not on file  Physical Activity: Not on file  Stress: Not on file  Social Connections: Not on file  Intimate Partner Violence: Not on file      FAMILY HISTORY:  History reviewed. No pertinent family history.   REVIEW OF SYSTEMS:  Constitutional: denies weight loss, fever, chills, or sweats  Eyes: denies any other vision changes, history of eye injury  ENT: denies sore throat, hearing problems  Respiratory: denies shortness of breath, wheezing  Cardiovascular: denies chest pain, palpitations  Gastrointestinal: Positive abdominal pain, nausea and vomiting Genitourinary: denies burning with urination or urinary frequency Musculoskeletal: denies any other joint pains or cramps  Skin: denies any other rashes or skin discolorations  Neurological: denies any other headache, dizziness, weakness  Psychiatric: denies any other depression, anxiety   All other review of systems  were negative   VITAL SIGNS:  Temp:  [96.8 F (36 C)-98.3 F (36.8 C)] 97.9 F (36.6 C) (07/25 1518) Pulse Rate:  [29-95] 71 (07/25 1345) Resp:  [8-20] 18 (07/25 1518) BP: (88-185)/(61-117) 162/75 (07/25 1518) SpO2:  [91 %-100 %] 100 % (07/25 1518) Weight:  [86.2 kg] 86.2 kg (07/25 0953)     Height: 6' (182.9 cm) Weight: 86.2 kg BMI (Calculated): 25.76   INTAKE/OUTPUT:  This shift: Total I/O In: 620 [P.O.:120; I.V.:500] Out: 475 [Urine:350; Emesis/NG output:125]  Last 2 shifts: '@IOLAST2SHIFTS'$ @   PHYSICAL EXAM:  Constitutional:  -- Normal body habitus  -- Awake, alert, and oriented x3  Eyes:  -- Pupils equally round and reactive to light  -- No scleral icterus  Ear, nose, and throat:  -- No jugular venous distension  Pulmonary:  -- No crackles  -- Equal breath sounds bilaterally -- Breathing non-labored at rest Cardiovascular:  -- S1, S2 present  -- No pericardial rubs Gastrointestinal:  -- Abdomen soft, nontender, non-distended, no guarding or rebound tenderness -- No abdominal masses appreciated, pulsatile or otherwise  Musculoskeletal and Integumentary:  -- Wounds: None appreciated -- Extremities: B/L UE and LE FROM, hands and feet warm, no edema  Neurologic:  -- Motor function: intact and symmetric -- Sensation: intact and symmetric   Labs:     Latest Ref Rng & Units 04/25/2022    5:05 AM 04/23/2022    7:02 PM 04/09/2014    1:54 AM  CBC  WBC 4.0 - 10.5 K/uL 10.9  11.1  15.8   Hemoglobin 13.0 - 17.0 g/dL 17.4  16.5  16.8   Hematocrit 39.0 - 52.0 % 50.4  48.7  49.2   Platelets 150 - 400 K/uL 250  224  210       Latest Ref Rng & Units 04/25/2022    5:05 AM 04/24/2022   12:42 PM 04/23/2022    7:02 PM  CMP  Glucose 70 - 99 mg/dL 100   125   BUN 8 - 23 mg/dL 18   12   Creatinine 0.61 - 1.24 mg/dL 1.05   0.80   Sodium 135 - 145 mmol/L 140   139   Potassium 3.5 - 5.1 mmol/L 3.5   3.1   Chloride 98 - 111 mmol/L 107   109   CO2 22 - 32 mmol/L 22   21   Calcium  8.9 - 10.3 mg/dL 9.2   8.8   Total Protein 6.5 - 8.1 g/dL 7.4   6.8   Total Bilirubin 0.3 - 1.2 mg/dL 3.4  3.8  5.9   Alkaline Phos 38 - 126 U/L 200   176   AST 15 - 41 U/L 75   157   ALT 0 - 44 U/L 231   326     Imaging studies:  EXAM: MRI ABDOMEN WITHOUT AND WITH CONTRAST (INCLUDING MRCP)   TECHNIQUE: Multiplanar multisequence MR imaging of the abdomen was  performed both before and after the administration of intravenous contrast. Heavily T2-weighted images of the biliary and pancreatic ducts were obtained, and three-dimensional MRCP images were rendered by post processing.   CONTRAST:  75m GADAVIST GADOBUTROL 1 MMOL/ML IV SOLN   COMPARISON:  None Available.   FINDINGS: Lower chest:  Lung bases are clear.   Hepatobiliary: Multiple large gallstones fill the lumen of the gallbladder. Stones range in size from 8 mm to 28 mm. No gallbladder wall thickening or pericholecystic fluid.   The common bile duct is normal caliber measuring 5 mm. No filling defect the common bile duct. No intrahepatic biliary duct dilatation.   There several small benign cystic lesion within the LEFT and RIGHT hepatic lobe.   Pancreas: Pancreatic duct is normal caliber. No variant ductal anatomy identified. Normal pancreas parenchyma. No peripancreatic fluid collections or significant inflammation.   Small cystic lesion measuring 5 mm in the mid body pancreas supra 19/4. Mild ductal ectasia in the mid body of the pancreas (image 8/series 17).   Spleen: Normal spleen.   Adrenals/urinary tract: Adrenal glands normal. Benign nonenhancing cyst the upper pole of the RIGHT kidney.   Stomach/Bowel: Stomach and limited of the small bowel is unremarkable   Vascular/Lymphatic: Abdominal aortic normal caliber. No retroperitoneal periportal lymphadenopathy.   Musculoskeletal: No aggressive osseous lesion   IMPRESSION: 1. Multiple large gallstones within lumen the gallbladder. No evidence acute  cholecystitis. 2. No choledocholithiasis.  No biliary duct dilatation 3. No pancreatic inflammation or duct dilatation. Normal pancreatic ductal anatomy. Mild benign appearing side branch ductal ectasia. 4. Benign Bosniak 1 cyst of the RIGHT kidney. No follow-up recommended     Electronically Signed   By: SSuzy BouchardM.D.   On: 04/24/2022 17:31  Assessment/Plan:  86y.o. male with left distal calculus with hydronephrosis.  Patient found with incidental abdominal gallstones on abdominal ultrasound.  Due to transaminitis he was asked to be seen by GI and surgery.  The fact the patient came with large left distal ureteral calculus with hydronephrosis and lower abdominal pain I see that gallstones are an incidental finding.  MRCP shows no common bile duct dilation to explain passing stone from the gallbladder.  I think that this is an incidental finding and no further management should be addressed at this moment.  If in the future patient started having upper abdominal pain further work-up should be done.  At this moment I do not recommend any surgical intervention.  Patient can continue his management for his hydronephrosis as per urology and primary team.   EArnold Long MD

## 2022-04-25 NOTE — Progress Notes (Signed)
Patient transported to the OR via bed. Wife of husband pushed in wheelchair to pre-op with husband/patient

## 2022-04-25 NOTE — Progress Notes (Signed)
Inpatient Follow-up/Progress Note   Patient ID: Caleb Maldonado is a 86 y.o. male.  Overnight Events / Subjective Findings NAEON. NPO for urologic procedure today. Abdominal pain improving. LFTs improving. Was tolerating po yesterday. No n/v. Has not had any bowel movements since last seen yesterday. No other acute GI complaints.  Review of Systems  Constitutional:  Negative for activity change, appetite change, chills, diaphoresis, fatigue, fever and unexpected weight change.  HENT:  Negative for trouble swallowing and voice change.   Respiratory:  Negative for shortness of breath and wheezing.   Cardiovascular:  Negative for chest pain, palpitations and leg swelling.  Gastrointestinal:  Positive for abdominal pain (improving). Negative for abdominal distention, anal bleeding, blood in stool, constipation, diarrhea, nausea and vomiting.  Musculoskeletal:  Negative for arthralgias and myalgias.  Skin:  Negative for color change and pallor.  Neurological:  Negative for dizziness, syncope and weakness.  Psychiatric/Behavioral:  Negative for confusion. The patient is not nervous/anxious.   All other systems reviewed and are negative.    Medications  Current Facility-Administered Medications:    0.9 %  sodium chloride infusion, 250 mL, Intravenous, PRN, Agbata, Tochukwu, MD   acetaminophen (TYLENOL) tablet 650 mg, 650 mg, Oral, Q6H PRN **OR** acetaminophen (TYLENOL) suppository 650 mg, 650 mg, Rectal, Q6H PRN, Agbata, Tochukwu, MD   cefTRIAXone (ROCEPHIN) 1 g in sodium chloride 0.9 % 100 mL IVPB, 1 g, Intravenous, Once, Stoioff, Scott C, MD   HYDROcodone-acetaminophen (NORCO/VICODIN) 5-325 MG per tablet 1 tablet, 1 tablet, Oral, Q4H PRN, Agbata, Tochukwu, MD   lisinopril (ZESTRIL) tablet 20 mg, 20 mg, Oral, Daily, Agbata, Tochukwu, MD, 20 mg at 04/24/22 1035   multivitamin with minerals tablet 1 tablet, 1 tablet, Oral, Daily, Agbata, Tochukwu, MD, 1 tablet at 04/24/22 1036   ondansetron  (ZOFRAN) tablet 4 mg, 4 mg, Oral, Q6H PRN **OR** ondansetron (ZOFRAN) injection 4 mg, 4 mg, Intravenous, Q6H PRN, Agbata, Tochukwu, MD   sodium chloride flush (NS) 0.9 % injection 3 mL, 3 mL, Intravenous, Q12H, Agbata, Tochukwu, MD, 3 mL at 04/24/22 2037   sodium chloride flush (NS) 0.9 % injection 3 mL, 3 mL, Intravenous, PRN, Agbata, Tochukwu, MD  sodium chloride     cefTRIAXone (ROCEPHIN)  IV      sodium chloride, acetaminophen **OR** acetaminophen, HYDROcodone-acetaminophen, ondansetron **OR** ondansetron (ZOFRAN) IV, sodium chloride flush   Objective    Vitals:   04/24/22 1620 04/24/22 1745 04/24/22 2028 04/25/22 0415  BP:  (!) 185/71 (!) 155/106 135/83  Pulse:  76 80 73  Resp:  16 20 15   Temp: 98.3 F (36.8 C) 98 F (36.7 C) 98.3 F (36.8 C) 98.2 F (36.8 C)  TempSrc: Oral     SpO2:  97% 96% 94%     Physical Exam Vitals and nursing note reviewed.  Constitutional:      General: He is not in acute distress.    Appearance: Normal appearance. He is not ill-appearing, toxic-appearing or diaphoretic.  HENT:     Head: Normocephalic and atraumatic.     Nose: Nose normal.     Mouth/Throat:     Mouth: Mucous membranes are moist.     Pharynx: Oropharynx is clear.  Eyes:     General: Scleral icterus present.     Extraocular Movements: Extraocular movements intact.  Cardiovascular:     Rate and Rhythm: Normal rate and regular rhythm.     Heart sounds: Normal heart sounds. No murmur heard.    No friction rub. No gallop.  Pulmonary:     Effort: Pulmonary effort is normal. No respiratory distress.     Breath sounds: Normal breath sounds. No wheezing, rhonchi or rales.  Abdominal:     General: Abdomen is flat. Bowel sounds are normal. There is no distension.     Palpations: Abdomen is soft.     Tenderness: There is no abdominal tenderness. There is no guarding or rebound.  Musculoskeletal:     Cervical back: Neck supple.     Right lower leg: No edema.     Left lower leg: No  edema.  Skin:    General: Skin is warm and dry.     Coloration: Skin is jaundiced. Skin is not pale.  Neurological:     General: No focal deficit present.     Mental Status: He is alert and oriented to person, place, and time. Mental status is at baseline.  Psychiatric:        Mood and Affect: Mood normal.        Behavior: Behavior normal.        Thought Content: Thought content normal.        Judgment: Judgment normal.      Laboratory Data Recent Labs  Lab 04/23/22 1902 04/25/22 0505  WBC 11.1* 10.9*  HGB 16.5 17.4*  HCT 48.7 50.4  PLT 224 250   Recent Labs  Lab 04/23/22 1902 04/24/22 1242 04/25/22 0505  NA 139  --  140  K 3.1*  --  3.5  CL 109  --  107  CO2 21*  --  22  BUN 12  --  18  CREATININE 0.80  --  1.05  CALCIUM 8.8*  --  9.2  PROT 6.8  --  7.4  BILITOT 5.9* 3.8* 3.4*  ALKPHOS 176*  --  200*  ALT 326*  --  231*  AST 157*  --  75*  GLUCOSE 125*  --  100*   Recent Labs  Lab 04/24/22 0446  INR 1.0      Imaging Studies: MR ABDOMEN MRCP W WO CONTAST  Result Date: 04/24/2022 CLINICAL DATA:  Abnormal LFTs. Concern for choledocholithiasis. Rule out liver lesion. EXAM: MRI ABDOMEN WITHOUT AND WITH CONTRAST (INCLUDING MRCP) TECHNIQUE: Multiplanar multisequence MR imaging of the abdomen was performed both before and after the administration of intravenous contrast. Heavily T2-weighted images of the biliary and pancreatic ducts were obtained, and three-dimensional MRCP images were rendered by post processing. CONTRAST:  72m GADAVIST GADOBUTROL 1 MMOL/ML IV SOLN COMPARISON:  None Available. FINDINGS: Lower chest:  Lung bases are clear. Hepatobiliary: Multiple large gallstones fill the lumen of the gallbladder. Stones range in size from 8 mm to 28 mm. No gallbladder wall thickening or pericholecystic fluid. The common bile duct is normal caliber measuring 5 mm. No filling defect the common bile duct. No intrahepatic biliary duct dilatation. There several small  benign cystic lesion within the LEFT and RIGHT hepatic lobe. Pancreas: Pancreatic duct is normal caliber. No variant ductal anatomy identified. Normal pancreas parenchyma. No peripancreatic fluid collections or significant inflammation. Small cystic lesion measuring 5 mm in the mid body pancreas supra 19/4. Mild ductal ectasia in the mid body of the pancreas (image 8/series 17). Spleen: Normal spleen. Adrenals/urinary tract: Adrenal glands normal. Benign nonenhancing cyst the upper pole of the RIGHT kidney. Stomach/Bowel: Stomach and limited of the small bowel is unremarkable Vascular/Lymphatic: Abdominal aortic normal caliber. No retroperitoneal periportal lymphadenopathy. Musculoskeletal: No aggressive osseous lesion IMPRESSION: 1. Multiple large gallstones within lumen the  gallbladder. No evidence acute cholecystitis. 2. No choledocholithiasis.  No biliary duct dilatation 3. No pancreatic inflammation or duct dilatation. Normal pancreatic ductal anatomy. Mild benign appearing side branch ductal ectasia. 4. Benign Bosniak 1 cyst of the RIGHT kidney. No follow-up recommended Electronically Signed   By: Suzy Bouchard M.D.   On: 04/24/2022 17:31   MR 3D Recon At Scanner  Result Date: 04/24/2022 CLINICAL DATA:  Abnormal LFTs. Concern for choledocholithiasis. Rule out liver lesion. EXAM: MRI ABDOMEN WITHOUT AND WITH CONTRAST (INCLUDING MRCP) TECHNIQUE: Multiplanar multisequence MR imaging of the abdomen was performed both before and after the administration of intravenous contrast. Heavily T2-weighted images of the biliary and pancreatic ducts were obtained, and three-dimensional MRCP images were rendered by post processing. CONTRAST:  63m GADAVIST GADOBUTROL 1 MMOL/ML IV SOLN COMPARISON:  None Available. FINDINGS: Lower chest:  Lung bases are clear. Hepatobiliary: Multiple large gallstones fill the lumen of the gallbladder. Stones range in size from 8 mm to 28 mm. No gallbladder wall thickening or  pericholecystic fluid. The common bile duct is normal caliber measuring 5 mm. No filling defect the common bile duct. No intrahepatic biliary duct dilatation. There several small benign cystic lesion within the LEFT and RIGHT hepatic lobe. Pancreas: Pancreatic duct is normal caliber. No variant ductal anatomy identified. Normal pancreas parenchyma. No peripancreatic fluid collections or significant inflammation. Small cystic lesion measuring 5 mm in the mid body pancreas supra 19/4. Mild ductal ectasia in the mid body of the pancreas (image 8/series 17). Spleen: Normal spleen. Adrenals/urinary tract: Adrenal glands normal. Benign nonenhancing cyst the upper pole of the RIGHT kidney. Stomach/Bowel: Stomach and limited of the small bowel is unremarkable Vascular/Lymphatic: Abdominal aortic normal caliber. No retroperitoneal periportal lymphadenopathy. Musculoskeletal: No aggressive osseous lesion IMPRESSION: 1. Multiple large gallstones within lumen the gallbladder. No evidence acute cholecystitis. 2. No choledocholithiasis.  No biliary duct dilatation 3. No pancreatic inflammation or duct dilatation. Normal pancreatic ductal anatomy. Mild benign appearing side branch ductal ectasia. 4. Benign Bosniak 1 cyst of the RIGHT kidney. No follow-up recommended Electronically Signed   By: SSuzy BouchardM.D.   On: 04/24/2022 17:31   CT ABDOMEN PELVIS W CONTRAST  Result Date: 04/24/2022 CLINICAL DATA:  Abdominal pain and abnormal LFTs. EXAM: CT ABDOMEN AND PELVIS WITH CONTRAST TECHNIQUE: Multidetector CT imaging of the abdomen and pelvis was performed using the standard protocol following bolus administration of intravenous contrast. RADIATION DOSE REDUCTION: This exam was performed according to the departmental dose-optimization program which includes automated exposure control, adjustment of the mA and/or kV according to patient size and/or use of iterative reconstruction technique. CONTRAST:  1034mOMNIPAQUE IOHEXOL  300 MG/ML  SOLN COMPARISON:  None Available. FINDINGS: Lower Chest: Normal. Hepatobiliary: Normal hepatic contours. No intra- or extrahepatic biliary dilatation. The gallbladder is normal. Pancreas: Normal pancreas. No ductal dilatation or peripancreatic fluid collection. Spleen: Normal. Adrenals/Urinary Tract: The adrenal glands are normal. There is a 10 mm stone at the left ureterovesical junction. There is no hydronephrosis. Mild left periureteral stranding. No other renal or ureteral calculi. The urinary bladder is normal for degree of distention Stomach/Bowel: There is no hiatal hernia. Normal duodenal course and caliber. No small bowel dilatation or inflammation. No focal colonic abnormality. Normal appendix. Vascular/Lymphatic: There is calcific atherosclerosis of the abdominal aorta. No lymphadenopathy. Reproductive: Enlarged prostate measures 6 cm in transverse dimension. Other: None. Musculoskeletal: No bony spinal canal stenosis or focal osseous abnormality. IMPRESSION: 1. A 10 mm stone at the left ureterovesical junction without hydronephrosis.  2. Enlarged prostate. Electronically Signed   By: Ulyses Jarred M.D.   On: 04/24/2022 03:34   US ABDOMEN LIMITED RUQ (LIVER/GB)  Result Date: 04/24/2022 CLINICAL DATA:  Abdominal pain, abnormal LFTs EXAM: ULTRASOUND ABDOMEN LIMITED RIGHT UPPER QUADRANT COMPARISON:  CT abdomen/pelvis dated 04/09/2014 FINDINGS: Gallbladder: Multiple gallstones measuring up to 11 mm. No gallbladder wall thickening or pericholecystic fluid. Negative sonographic Murphy's sign. Common bile duct: Diameter: 4 mm Liver: Hyperechoic hepatic parenchyma, suggesting hepatic steatosis. No focal hepatic lesion is seen. Portal vein is patent on color Doppler imaging with normal direction of blood flow towards the liver. Other: None. IMPRESSION: Cholelithiasis, without associated sonographic findings to suggest acute cholecystitis. Suspected hepatic steatosis. Electronically Signed   By:  Julian Hy M.D.   On: 04/24/2022 00:02    Assessment:   # Hepatocellular transaminitis - Rfactor 6.2 - cbd normal on ct/US; no signs of obstruction             - fatty liver disease - rare tylenol use, no use of new medications or changes in doses - no known hypotensive episodes - remote h/o colon cancer- last colonoscopy 2014 - initial labs AST 157 ALT 326 total bili 5.9 lipase 27 alk phos 176 creatinine 0.8 INR 1.0. 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  Continue to trend  -Suspicion is for either passed gallstone versus hypoperfusion episode leading to ischemic hepatitis although mild based on enzymes  -No duct dilation.  MRI MRCP negative for choledocholithiasis - continue supportive care -Surgery is following -Tylenol salicylate levels negative viral hepatitis negative, iron panel is within normal limits -Autoimmune testing pending and can be followed up outpatient -Okay for diet from GI perspective -Supportive care at this time, pain control, anti emetics, ivf as per primary team Avoid hepatotoxic agents  - can follow up with PCP to trend liver enzymes down as well.  - GI will follow peripherally going forward. Available as needed.  I personally performed the service.  Management of other medical comorbidities as per primary team  Thank you for allowing [place] to participate in this patient's care. Please don't hesitate to call if any questions or concerns arise.   Annamaria Helling, DO [facility]  Portions of the record may have been created with voice recognition software.  Occasional wrong-word or 'sound-a-like' substitutions may have occurred due to the inherent limitations of voice recognition software.  Read the chart carefully and recognize, using context, where substitutions may have occurred.

## 2022-04-25 NOTE — Anesthesia Postprocedure Evaluation (Signed)
Anesthesia Post Note  Patient: Caleb Maldonado  Procedure(s) Performed: CYSTOSCOPY/HOLMIUM LASER/ RETORGRADE PYLEOGRAM/ LITHOLAPLAXY (Left: Bladder)  Patient location during evaluation: PACU Anesthesia Type: General Level of consciousness: awake and alert Pain management: pain level controlled Vital Signs Assessment: post-procedure vital signs reviewed and stable Respiratory status: spontaneous breathing, nonlabored ventilation, respiratory function stable and patient connected to nasal cannula oxygen Cardiovascular status: blood pressure returned to baseline and stable Postop Assessment: no apparent nausea or vomiting Anesthetic complications: no   No notable events documented.   Last Vitals:  Vitals:   04/25/22 1300 04/25/22 1315  BP:  (!) 168/81  Pulse: (!) 52 76  Resp: 19 16  Temp:  (!) 36.4 C  SpO2: 93% 91%    Last Pain:  Vitals:   04/25/22 1300  TempSrc:   PainSc: Asleep                 Arita Miss

## 2022-04-25 NOTE — Transfer of Care (Signed)
Immediate Anesthesia Transfer of Care Note  Patient: Caleb Maldonado  Procedure(s) Performed: CYSTOSCOPY/HOLMIUM LASER/ RETORGRADE PYLEOGRAM/ LITHOLAPLAXY (Left: Bladder)  Patient Location: PACU  Anesthesia Type:General  Level of Consciousness: awake, drowsy and patient cooperative  Airway & Oxygen Therapy: Patient Spontanous Breathing and Patient connected to face mask oxygen  Post-op Assessment: Report given to RN and Post -op Vital signs reviewed and stable  Post vital signs: Reviewed and stable  Last Vitals:  Vitals Value Taken Time  BP 88/77 04/25/22 1140  Temp    Pulse 93 04/25/22 1144  Resp 17 04/25/22 1144  SpO2 99 % 04/25/22 1144  Vitals shown include unvalidated device data.  Last Pain:  Vitals:   04/25/22 0953  TempSrc: Temporal  PainSc: 0-No pain         Complications: No notable events documented.

## 2022-04-26 DIAGNOSIS — N132 Hydronephrosis with renal and ureteral calculous obstruction: Secondary | ICD-10-CM | POA: Diagnosis not present

## 2022-04-26 LAB — HEPATIC FUNCTION PANEL
ALT: 165 U/L — ABNORMAL HIGH (ref 0–44)
AST: 48 U/L — ABNORMAL HIGH (ref 15–41)
Albumin: 3.6 g/dL (ref 3.5–5.0)
Alkaline Phosphatase: 156 U/L — ABNORMAL HIGH (ref 38–126)
Bilirubin, Direct: 0.7 mg/dL — ABNORMAL HIGH (ref 0.0–0.2)
Indirect Bilirubin: 1.3 mg/dL — ABNORMAL HIGH (ref 0.3–0.9)
Total Bilirubin: 2 mg/dL — ABNORMAL HIGH (ref 0.3–1.2)
Total Protein: 6.8 g/dL (ref 6.5–8.1)

## 2022-04-26 MED ORDER — HYDROCODONE-ACETAMINOPHEN 5-325 MG PO TABS: 1.0000 | ORAL_TABLET | Freq: Four times a day (QID) | ORAL | 0 refills | Status: AC | PRN

## 2022-04-26 NOTE — Discharge Summary (Signed)
Caleb Maldonado Caleb Maldonado:[id number] DOB: 02-02-1936 DOA: 04/24/2022  PCP: [name], MD  Admit date: 04/24/2022 Discharge date: 04/26/2022  Admitted From: Home Disposition: Home  Recommendations for Outpatient Follow-up:  Follow up with PCP in 1 week Please obtain CMP/CBC in one week Please follow up urology in 1 month    Discharge Condition:Stable CODE STATUS: Full Diet recommendation: Heart Healthy  Brief/Interim Summary: Per IZT:IWPYKD Caleb Maldonado is a 86 y.o. male with medical history significant for hypertension, prior history of GI bleed, nephrolithiasis, basal cell carcinoma who presents to the ER for evaluation of intermittent episodes of lower abdominal pain over the last 1 month. He presented to the ER for evaluation due to the severity of the episode he had on that day and the prolonged duration.  He rated the pain an 8 x 10 in intensity at its worst in both lower quadrants and nonradiating.He had a renal stone CT which showed a 10 mm stone at the left ureterovesical junction without hydronephrosis.  Enlarged prostate.  Urology was consulted.  He underwent status post Cystoscopy with left retrograde pyelogram and cystolitholapaxy.  He was also found with elevated liver enzymes, GI was consulted.  He was hydrated.  His liver enzymes trended down.  He was also found incidentally with gallstones and since he was asymptomatic there was no surgical intervention as surgery was consulted for this.  His Foley was taken out today and he voided.  He ambulated.  He is stable for discharge.    Ureteral stone with hydronephrosis Urology consulted Status post cystoscopy and cystolitholapaxy on 7/25 by Dr. Bernardo Heater His Foley was discontinued He did receive IV antibiotics and per urology PA he does not need antibiotics for discharge He voided post Foley removed       Transaminitis Cholelithiasis Patient noted to have marked transaminitis Gallbladder ultrasound shows cholelithiasis, without  associated sonographic findings to suggest acute cholecystitis as well as hepatic steatosis. MRI/MRCP completed and found with multiple large gallstones without evidence of cholecystitis.  Please see full report below Surgery was consulted-no further management for the gallstones.  If in the future patient started having upper abdominal pain further work-up should be done then. GI was consulted for elevated LFTs-Suspicion is for either passed gallstone versus hypoperfusion episode leading to ischemic hepatitis although mild based on enzymes  No duct dilation.  MRI MRCP negative for choledocholithiasis They recommended supportive care.  Avoid hepatotoxic agents His LFTs have trended down We will need to follow-up with PCP which she already has an appointment for repeat LFTs      Essential hypertension Continue lisinopril       Discharge Diagnoses:  Principal Problem:   Ureteral stone with hydronephrosis Active Problems:   Essential hypertension   Transaminitis   Kidney stone   Cholelithiases    Discharge Instructions  Discharge Instructions     Diet - low sodium heart healthy   Complete by: As directed    Increase activity slowly   Complete by: As directed    No wound care   Complete by: As directed       Allergies as of 04/26/2022   No Known Allergies      Medication List     TAKE these medications    CENTRUM SILVER PO Take by mouth.   HYDROcodone-acetaminophen 5-325 MG tablet Commonly known as: NORCO/VICODIN Take 1 tablet by mouth every 6 (six) hours as needed for up to 2 days for moderate pain or severe pain.   lisinopril 40  MG tablet Commonly known as: ZESTRIL Take 40 mg by mouth daily. What changed: Another medication with the same name was removed. Continue taking this medication, and follow the directions you see here.        Follow-up Information     Stoioff, Caleb Fairly, MD Follow up in 1 month(s).   Specialty: Urology Contact  information: [place] [address] [phone]         [name], MD Follow up in 1 week(s).   Specialty: Family Medicine Why: need blood work Sport and exercise psychologist information: [place] [facility] [place] [address] [phone]                No Known Allergies  Consultations: GI, urology, surgery   Procedures/Studies: DG OR UROLOGY CYSTO IMAGE ([place])  Result Date: 04/25/2022 There is no interpretation for this exam.  This order is for images obtained during a surgical procedure.  Please See "Surgeries" Tab for more information regarding the procedure.   MR ABDOMEN MRCP W WO CONTAST  Result Date: 04/24/2022 CLINICAL DATA:  Abnormal LFTs. Concern for choledocholithiasis. Rule out liver lesion. EXAM: MRI ABDOMEN WITHOUT AND WITH CONTRAST (INCLUDING MRCP) TECHNIQUE: Multiplanar multisequence MR imaging of the abdomen was performed both before and after the administration of intravenous contrast. Heavily T2-weighted images of the biliary and pancreatic ducts were obtained, and three-dimensional MRCP images were rendered by post processing. CONTRAST:  80m GADAVIST GADOBUTROL 1 MMOL/ML IV SOLN COMPARISON:  None Available. FINDINGS: Lower chest:  Lung bases are clear. Hepatobiliary: Multiple large gallstones fill the lumen of the gallbladder. Stones range in size from 8 mm to 28 mm. No gallbladder wall thickening or pericholecystic fluid. The common bile duct is normal caliber measuring 5 mm. No filling defect the common bile duct. No intrahepatic biliary duct dilatation. There several small benign cystic lesion within the LEFT and RIGHT hepatic lobe. Pancreas: Pancreatic duct is normal caliber. No variant ductal anatomy identified. Normal pancreas parenchyma. No peripancreatic fluid collections or significant inflammation. Small cystic lesion measuring 5 mm in the mid body pancreas supra 19/4. Mild ductal ectasia in the mid  body of the pancreas (image 8/series 17). Spleen: Normal spleen. Adrenals/urinary tract: Adrenal glands normal. Benign nonenhancing cyst the upper pole of the RIGHT kidney. Stomach/Bowel: Stomach and limited of the small bowel is unremarkable Vascular/Lymphatic: Abdominal aortic normal caliber. No retroperitoneal periportal lymphadenopathy. Musculoskeletal: No aggressive osseous lesion IMPRESSION: 1. Multiple large gallstones within lumen the gallbladder. No evidence acute cholecystitis. 2. No choledocholithiasis.  No biliary duct dilatation 3. No pancreatic inflammation or duct dilatation. Normal pancreatic ductal anatomy. Mild benign appearing side branch ductal ectasia. 4. Benign Bosniak 1 cyst of the RIGHT kidney. No follow-up recommended Electronically Signed   By: SSuzy BouchardM.D.   On: 04/24/2022 17:31   MR 3D Recon At Scanner  Result Date: 04/24/2022 CLINICAL DATA:  Abnormal LFTs. Concern for choledocholithiasis. Rule out liver lesion. EXAM: MRI ABDOMEN WITHOUT AND WITH CONTRAST (INCLUDING MRCP) TECHNIQUE: Multiplanar multisequence MR imaging of the abdomen was performed both before and after the administration of intravenous contrast. Heavily T2-weighted images of the biliary and pancreatic ducts were obtained, and three-dimensional MRCP images were rendered by post processing. CONTRAST:  831mGADAVIST GADOBUTROL 1 MMOL/ML IV SOLN COMPARISON:  None Available. FINDINGS: Lower chest:  Lung bases are clear. Hepatobiliary: Multiple large gallstones fill the lumen of the gallbladder. Stones range in size from 8 mm to 28 mm. No gallbladder  wall thickening or pericholecystic fluid. The common bile duct is normal caliber measuring 5 mm. No filling defect the common bile duct. No intrahepatic biliary duct dilatation. There several small benign cystic lesion within the LEFT and RIGHT hepatic lobe. Pancreas: Pancreatic duct is normal caliber. No variant ductal anatomy identified. Normal pancreas parenchyma. No  peripancreatic fluid collections or significant inflammation. Small cystic lesion measuring 5 mm in the mid body pancreas supra 19/4. Mild ductal ectasia in the mid body of the pancreas (image 8/series 17). Spleen: Normal spleen. Adrenals/urinary tract: Adrenal glands normal. Benign nonenhancing cyst the upper pole of the RIGHT kidney. Stomach/Bowel: Stomach and limited of the small bowel is unremarkable Vascular/Lymphatic: Abdominal aortic normal caliber. No retroperitoneal periportal lymphadenopathy. Musculoskeletal: No aggressive osseous lesion IMPRESSION: 1. Multiple large gallstones within lumen the gallbladder. No evidence acute cholecystitis. 2. No choledocholithiasis.  No biliary duct dilatation 3. No pancreatic inflammation or duct dilatation. Normal pancreatic ductal anatomy. Mild benign appearing side branch ductal ectasia. 4. Benign Bosniak 1 cyst of the RIGHT kidney. No follow-up recommended Electronically Signed   By: Suzy Bouchard M.D.   On: 04/24/2022 17:31   CT ABDOMEN PELVIS W CONTRAST  Result Date: 04/24/2022 CLINICAL DATA:  Abdominal pain and abnormal LFTs. EXAM: CT ABDOMEN AND PELVIS WITH CONTRAST TECHNIQUE: Multidetector CT imaging of the abdomen and pelvis was performed using the standard protocol following bolus administration of intravenous contrast. RADIATION DOSE REDUCTION: This exam was performed according to the departmental dose-optimization program which includes automated exposure control, adjustment of the mA and/or kV according to patient size and/or use of iterative reconstruction technique. CONTRAST:  172m OMNIPAQUE IOHEXOL 300 MG/ML  SOLN COMPARISON:  None Available. FINDINGS: Lower Chest: Normal. Hepatobiliary: Normal hepatic contours. No intra- or extrahepatic biliary dilatation. The gallbladder is normal. Pancreas: Normal pancreas. No ductal dilatation or peripancreatic fluid collection. Spleen: Normal. Adrenals/Urinary Tract: The adrenal glands are normal. There is a  10 mm stone at the left ureterovesical junction. There is no hydronephrosis. Mild left periureteral stranding. No other renal or ureteral calculi. The urinary bladder is normal for degree of distention Stomach/Bowel: There is no hiatal hernia. Normal duodenal course and caliber. No small bowel dilatation or inflammation. No focal colonic abnormality. Normal appendix. Vascular/Lymphatic: There is calcific atherosclerosis of the abdominal aorta. No lymphadenopathy. Reproductive: Enlarged prostate measures 6 cm in transverse dimension. Other: None. Musculoskeletal: No bony spinal canal stenosis or focal osseous abnormality. IMPRESSION: 1. A 10 mm stone at the left ureterovesical junction without hydronephrosis. 2. Enlarged prostate. Electronically Signed   By: KUlyses JarredM.D.   On: 04/24/2022 03:34   UKoreaABDOMEN LIMITED RUQ (LIVER/GB)  Result Date: 04/24/2022 CLINICAL DATA:  Abdominal pain, abnormal LFTs EXAM: ULTRASOUND ABDOMEN LIMITED RIGHT UPPER QUADRANT COMPARISON:  CT abdomen/pelvis dated 04/09/2014 FINDINGS: Gallbladder: Multiple gallstones measuring up to 11 mm. No gallbladder wall thickening or pericholecystic fluid. Negative sonographic Murphy's sign. Common bile duct: Diameter: 4 mm Liver: Hyperechoic hepatic parenchyma, suggesting hepatic steatosis. No focal hepatic lesion is seen. Portal vein is patent on color Doppler imaging with normal direction of blood flow towards the liver. Other: None. IMPRESSION: Cholelithiasis, without associated sonographic findings to suggest acute cholecystitis. Suspected hepatic steatosis. Electronically Signed   By: SJulian HyM.D.   On: 04/24/2022 00:02      Subjective: Has no pain.  Has no complaints, +voided  Discharge Exam: Vitals:   04/26/22 0343 04/26/22 0750  BP: (!) 150/97 (!) 141/55  Pulse: 74 73  Resp: 16 18  Temp: 97.7 F (36.5 C) 98.2 F (36.8 C)  SpO2: 94% 95%   Vitals:   04/25/22 1518 04/25/22 1942 04/26/22 0343 04/26/22 0750   BP: (!) 162/75 (!) 157/98 (!) 150/97 (!) 141/55  Pulse:  89 74 73  Resp: '18 16 16 18  '$ Temp: 97.9 F (36.6 C) 97.8 F (36.6 C) 97.7 F (36.5 C) 98.2 F (36.8 C)  TempSrc: Oral Oral Oral   SpO2: 100% 95% 94% 95%  Weight:      Height:        General: Pt is alert, awake, not in acute distress Cardiovascular: RRR, S1/S2 +, no rubs, no gallops Respiratory: CTA bilaterally, no wheezing, no rhonchi Abdominal: Soft, NT, ND, bowel sounds + Extremities: no edema, no cyanosis    The results of significant diagnostics from this hospitalization (including imaging, microbiology, ancillary and laboratory) are listed below for reference.     Microbiology: Recent Results (from the past 240 hour(s))  Urine Culture     Status: None   Collection Time: 04/24/22  3:58 AM   Specimen: Urine, Random  Result Value Ref Range Status   Specimen Description   Final    URINE, RANDOM Performed at [facility], [address] [place], [place] [address]    Special Requests   Final    NONE Performed at [facility], [address], [place], [place] [address]    Culture   Final    NO GROWTH Performed at [facility], [place] [address], [place], [place] [address]    Report Status 04/25/2022 FINAL  Final     Labs: BNP (last 3 results) No results for input(s): "BNP" in the last 8760 hours. Basic Metabolic Panel: Recent Labs  Lab 04/23/22 1902 04/23/22 2203 04/25/22 0505  NA 139  --  140  K 3.1*  --  3.5  CL 109  --  107  CO2 21*  --  22  GLUCOSE 125*  --  100*  BUN 12  --  18  CREATININE 0.80  --  1.05  CALCIUM 8.8*  --  9.2  MG  --  2.2  --    Liver Function Tests: Recent Labs  Lab 04/23/22 1902 04/24/22 1242 04/25/22 0505 04/26/22 0546  AST 157*  --  75* 48*  ALT 326*  --  231* 165*  ALKPHOS 176*  --  200* 156*  BILITOT 5.9* 3.8* 3.4* 2.0*  PROT 6.8  --  7.4 6.8  ALBUMIN 3.7  --  4.2 3.6   Recent Labs  Lab 04/23/22 1902  LIPASE 27   No results  for input(s): "AMMONIA" in the last 168 hours. CBC: Recent Labs  Lab 04/23/22 1902 04/25/22 0505  WBC 11.1* 10.9*  HGB 16.5 17.4*  HCT 48.7 50.4  MCV 87.1 86.3  PLT 224 250   Cardiac Enzymes: No results for input(s): "CKTOTAL", "CKMB", "CKMBINDEX", "TROPONINI" in the last 168 hours. BNP: Invalid input(s): "POCBNP" CBG: No results for input(s): "GLUCAP" in the last 168 hours. D-Dimer No results for input(s): "DDIMER" in the last 72 hours. Hgb A1c No results for input(s): "HGBA1C" in the last 72 hours. Lipid Profile No results for input(s): "CHOL", "HDL", "LDLCALC", "TRIG", "CHOLHDL", "LDLDIRECT" in the last 72 hours. Thyroid function studies No results for input(s): "TSH", "T4TOTAL", "T3FREE", "THYROIDAB" in the last 72 hours.  Invalid input(s): "FREET3" Anemia work up Recent Labs    04/24/22 1242  FERRITIN 330  TIBC 249*  IRON 118  Urinalysis    Component Value Date/Time   COLORURINE AMBER (A) 04/23/2022 2203   APPEARANCEUR CLEAR (A) 04/23/2022 2203   APPEARANCEUR Hazy 04/09/2014 0154   LABSPEC 1.024 04/23/2022 2203   LABSPEC 1.033 04/09/2014 0154   PHURINE 5.0 04/23/2022 2203   GLUCOSEU NEGATIVE 04/23/2022 2203   GLUCOSEU Negative 04/09/2014 0154   HGBUR NEGATIVE 04/23/2022 2203   BILIRUBINUR MODERATE (A) 04/23/2022 2203   BILIRUBINUR Negative 04/09/2014 0154   KETONESUR 20 (A) 04/23/2022 2203   PROTEINUR 30 (A) 04/23/2022 2203   NITRITE NEGATIVE 04/23/2022 2203   LEUKOCYTESUR NEGATIVE 04/23/2022 2203   LEUKOCYTESUR Negative 04/09/2014 0154   Sepsis Labs Recent Labs  Lab 04/23/22 1902 04/25/22 0505  WBC 11.1* 10.9*   Microbiology Recent Results (from the past 240 hour(s))  Urine Culture     Status: None   Collection Time: 04/24/22  3:58 AM   Specimen: Urine, Random  Result Value Ref Range Status   Specimen Description   Final    URINE, RANDOM Performed at [facility], [address] [place], [place] [address]    Special Requests    Final    NONE Performed at [facility], [address], [place], [place] [address]    Culture   Final    NO GROWTH Performed at [facility], [place] [address] [place], [place] [address]    Report Status 04/25/2022 FINAL  Final     Time coordinating discharge: Over 30 minutes  SIGNED:   Nolberto Hanlon, MD  [facility] 04/26/2022, 11:37 AM Pager   If 7PM-7AM, please contact night-coverage [email or web address] Password [email or web address]

## 2022-04-26 NOTE — Progress Notes (Signed)
Mobility Specialist - Progress Note    04/26/22 1000  Mobility  Activity Ambulated with assistance in hallway;Stood at bedside  Level of Assistance Modified independent, requires aide device or extra time  Assistive Device None  Distance Ambulated (ft) 200 ft  Activity Response Tolerated well  $Mobility charge 1 Mobility    Pt sitting EOB using RA upon arrival. Denies any pain. Pt completes STS indep and ambulates 1 lap around NS CGA/MODI-- one mild self corrected LOB, but pt states it's from lying in bed. Tolerates well and voices being back close to baseline. Pt returns to bed with needs in reach, family at bedside.  Caleb Maldonado Mobility Specialist 04/26/22, 10:28 AM

## 2022-05-04 DIAGNOSIS — Z87442 Personal history of urinary calculi: Secondary | ICD-10-CM | POA: Diagnosis not present

## 2022-05-04 DIAGNOSIS — I10 Essential (primary) hypertension: Secondary | ICD-10-CM | POA: Diagnosis not present

## 2022-05-04 DIAGNOSIS — R7401 Elevation of levels of liver transaminase levels: Secondary | ICD-10-CM | POA: Diagnosis not present

## 2022-05-26 ENCOUNTER — Ambulatory Visit (INDEPENDENT_AMBULATORY_CARE_PROVIDER_SITE_OTHER): Payer: Medicare HMO | Admitting: Physician Assistant

## 2022-05-26 ENCOUNTER — Encounter: Payer: Self-pay | Admitting: Physician Assistant

## 2022-05-26 VITALS — BP 148/70 | HR 88 | Ht 72.0 in | Wt 204.0 lb

## 2022-05-26 DIAGNOSIS — N201 Calculus of ureter: Secondary | ICD-10-CM

## 2022-05-26 DIAGNOSIS — N400 Enlarged prostate without lower urinary tract symptoms: Secondary | ICD-10-CM

## 2022-05-26 LAB — BLADDER SCAN AMB NON-IMAGING

## 2022-05-26 NOTE — Progress Notes (Signed)
05/26/2022 9:59 AM   Caleb Maldonado 07-Mar-1936 [id number]  CC: Chief Complaint  Patient presents with   Follow-up    Post Op   New Patient (Initial Visit)    HPI: Caleb Maldonado is a 86 y.o. male recently admitted for renal colic due to a a 10 mm distal left ureteral stone, s/p cystolitholapaxy with Dr. Bernardo Heater on 04/25/2022 with intraoperative findings of interval passage of the stone to the bladder as well as a small urethral meatus requiring dilation and prominent lateral lobe enlargement with a moderate median lobe and moderate bladder trabeculation.  He is here today for postop follow-up and symptom recheck.  He is here with his wife, who contributes to HPI.  Today he reports feeling well since his recent surgery.  He denies gross hematuria, dysuria, flank pain, penile pain, weak stream, intermittency, and straining.  He reports that his urine is yellow after he drinks soda and clear after he drinks water and wonders how long that will last.  CTAP with contrast dated 04/24/2022 noted no other urolithiasis and an enlarged prostate.  IPSS 2/pleased today. PVR 71m.   IPSS     Row Name 05/26/22 0900         International Prostate Symptom Score   How often have you had the sensation of not emptying your bladder? Not at All     How often have you had to urinate less than every two hours? Not at All     How often have you found you stopped and started again several times when you urinated? Not at All     How often have you found it difficult to postpone urination? Not at All     How often have you had a weak urinary stream? Not at All     How often have you had to strain to start urination? Not at All     How many times did you typically get up at night to urinate? 2 Times     Total IPSS Score 2       Quality of Life due to urinary symptoms   If you were to spend the rest of your life with your urinary condition just the way it is now how would you feel about that? Pleased               PMH: Past Medical History:  Diagnosis Date   Actinic keratosis    Basal cell carcinoma 12/20/2015   R nasal tip, R nasolabial    Basal cell carcinoma 01/12/2020   L upper nasal dorsum (clear w/bx), recurrent bx/edc 05/02/2021   Basal cell carcinoma 01/12/2020   Lower sternum, EDC 04/05/20    Basal cell carcinoma 02/26/2019   L post auricular neck    BCC (basal cell carcinoma of skin) 08/02/2021   R glabella, EDC   Hypertension     Surgical History: Past Surgical History:  Procedure Laterality Date   CYSTOSCOPY/URETEROSCOPY/HOLMIUM LASER/STENT PLACEMENT Left 04/25/2022   Procedure: CYSTOSCOPY/HOLMIUM LASER/ RETORGRADE PYLEOGRAM/ LITHOLAPLAXY;  Surgeon: SAbbie Sons MD;  Location: [facility] ORS;  Service: Urology;  Laterality: Left;   POLYPECTOMY  1992    Home Medications:  Allergies as of 05/26/2022   No Known Allergies      Medication List        Accurate as of May 26, 2022  9:59 AM. If you have any questions, ask your nurse or doctor.          CENTRUM  SILVER PO Take by mouth.   lisinopril 40 MG tablet Commonly known as: ZESTRIL Take 40 mg by mouth daily.        Allergies:  No Known Allergies  Family History: No family history on file.  Social History:   reports that he has never smoked. He has never been exposed to tobacco smoke. He has never used smokeless tobacco. No history on file for alcohol use and drug use.  Physical Exam: Ht 6' (1.829 m)   Wt 204 lb (92.5 kg)   BMI 27.67 kg/m   Constitutional:  Alert and oriented, no acute distress, nontoxic appearing HEENT: [place], AT Cardiovascular: No clubbing, cyanosis, or edema Respiratory: Normal respiratory effort, no increased work of breathing Skin: No rashes, bruises or suspicious lesions Neurologic: Grossly intact, no focal deficits, moving all 4 extremities Psychiatric: Normal mood and affect  Laboratory Data: Results for orders placed or performed in visit on 05/26/22   BLADDER SCAN AMB NON-IMAGING  Result Value Ref Range   Scan Result 33m    Assessment & Plan:   1. Left ureteral stone Interval passage into the bladder at the time of surgery, now s/p cystolitholapaxy.  He is asymptomatic.  No other stones per recent contrast CT scan.  We discussed staying hydrated to keep his urine clear and dilute the urine.  We discussed water and lemonade as the best fluids for stone prevention.  I advised him to avoid sodas containing phosphoric acid and try switching to sodas containing citric acid instead. - BLADDER SCAN AMB NON-IMAGING  2. Benign prostatic hyperplasia without lower urinary tract symptoms Asymptomatic, IPSS minimal today.  Will defer pharmacotherapy.  We discussed return precautions including worsening LUTS.  Return if symptoms worsen or fail to improve.  SDebroah Loop PA-C  [facility] [address] SVictoriaBPelion [place] [phone]

## 2022-05-26 NOTE — Patient Instructions (Addendum)
I've attached information on how to prevent kidney stones with your diet at the end of this packet.  When it comes to fluid intake, the best things you can drink to prevent kidney stones are water or lemonade. If you are drinking soda, look at the ingredients list. Sodas that contain phosphoric acid, like Coke and Pepsi, promote kidney stones. Sodas that contain citric acid, like Sprite, reduce the risk of kidney stones.

## 2022-08-02 DIAGNOSIS — I10 Essential (primary) hypertension: Secondary | ICD-10-CM | POA: Diagnosis not present

## 2022-08-02 DIAGNOSIS — Z87442 Personal history of urinary calculi: Secondary | ICD-10-CM | POA: Diagnosis not present

## 2022-08-09 DIAGNOSIS — Z23 Encounter for immunization: Secondary | ICD-10-CM | POA: Diagnosis not present

## 2022-08-09 DIAGNOSIS — Z1331 Encounter for screening for depression: Secondary | ICD-10-CM | POA: Diagnosis not present

## 2022-08-09 DIAGNOSIS — E6609 Other obesity due to excess calories: Secondary | ICD-10-CM | POA: Diagnosis not present

## 2022-08-09 DIAGNOSIS — Z136 Encounter for screening for cardiovascular disorders: Secondary | ICD-10-CM | POA: Diagnosis not present

## 2022-08-09 DIAGNOSIS — Z0000 Encounter for general adult medical examination without abnormal findings: Secondary | ICD-10-CM | POA: Diagnosis not present

## 2022-08-09 DIAGNOSIS — Z6830 Body mass index (BMI) 30.0-30.9, adult: Secondary | ICD-10-CM | POA: Diagnosis not present

## 2022-08-09 DIAGNOSIS — I10 Essential (primary) hypertension: Secondary | ICD-10-CM | POA: Diagnosis not present

## 2022-08-21 ENCOUNTER — Ambulatory Visit: Payer: Medicare HMO | Admitting: Dermatology

## 2022-08-21 DIAGNOSIS — Z1283 Encounter for screening for malignant neoplasm of skin: Secondary | ICD-10-CM | POA: Diagnosis not present

## 2022-08-21 DIAGNOSIS — L918 Other hypertrophic disorders of the skin: Secondary | ICD-10-CM | POA: Diagnosis not present

## 2022-08-21 DIAGNOSIS — D229 Melanocytic nevi, unspecified: Secondary | ICD-10-CM

## 2022-08-21 DIAGNOSIS — Z85828 Personal history of other malignant neoplasm of skin: Secondary | ICD-10-CM | POA: Diagnosis not present

## 2022-08-21 DIAGNOSIS — B079 Viral wart, unspecified: Secondary | ICD-10-CM

## 2022-08-21 DIAGNOSIS — L821 Other seborrheic keratosis: Secondary | ICD-10-CM | POA: Diagnosis not present

## 2022-08-21 DIAGNOSIS — L578 Other skin changes due to chronic exposure to nonionizing radiation: Secondary | ICD-10-CM

## 2022-08-21 DIAGNOSIS — L814 Other melanin hyperpigmentation: Secondary | ICD-10-CM

## 2022-08-21 NOTE — Progress Notes (Signed)
Follow-Up Visit   Subjective  Caleb Maldonado is a 86 y.o. male who presents for the following: Upper body skin exam (Hx of BCCs, hx of AKs).  The patient presents for Upper Body Skin Exam (UBSE) for skin cancer screening and mole check.  The patient has spots, moles and lesions to be evaluated, some may be new or changing and the patient has concerns that these could be cancer.   The following portions of the chart were reviewed this encounter and updated as appropriate:       Review of Systems:  No other skin or systemic complaints except as noted in HPI or Assessment and Plan.  Objective  Well appearing patient in no apparent distress; mood and affect are within normal limits.  All skin waist up examined.  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 Observe for recurrence. Call clinic for new or changing lesions.  Recommend regular skin exams, daily broad-spectrum spf 30+ sunscreen use, and photoprotection.    Viral warts, unspecified type (2) L hand dorsum x 2  Viral Wart (HPV) Counseling  Discussed viral / HPV (Human Papilloma Virus) etiology and risk of spread /infectivity to other areas of body as well as to other people.  Multiple treatments and methods may be required to clear warts and it is possible treatment may not be successful.  Treatment risks include discoloration; scarring and there is still potential for wart recurrence.  Destruction of lesion - L hand dorsum x 2  Destruction method: cryotherapy   Informed consent: discussed and consent obtained   Lesion destroyed using liquid nitrogen: Yes   Region frozen until ice ball extended beyond lesion: Yes   Outcome: patient tolerated procedure well with no complications   Post-procedure details: wound care  instructions given   Additional details:  Prior to procedure, discussed risks of blister formation, small wound, skin dyspigmentation, or rare scar following cryotherapy. Recommend Vaseline ointment to treated areas while healing.    Lentigines - Scattered tan macules - Due to sun exposure - Benign-appearing, observe - Recommend daily broad spectrum sunscreen SPF 30+ to sun-exposed areas, reapply every 2 hours as needed. - Call for any changes - back  Seborrheic Keratoses - Stuck-on, waxy, tan-brown papules and/or plaques  - Benign-appearing - Discussed benign etiology and prognosis. - Observe - Call for any changes - back, chest, arms, abdomen  Melanocytic Nevi - Tan-brown and/or pink-flesh-colored symmetric macules and papules - Benign appearing on exam today - Observation - Call clinic for new or changing moles - Recommend daily use of broad spectrum spf 30+ sunscreen to sun-exposed areas.  - back  Hemangiomas - Red papules - Discussed benign nature - Observe - Call for any changes - back  Actinic Damage - Chronic condition, secondary to cumulative UV/sun exposure - diffuse scaly erythematous macules with underlying dyspigmentation - Recommend daily broad spectrum sunscreen SPF 30+ to sun-exposed areas, reapply every 2 hours as needed.  - Staying in the shade or wearing long sleeves, sun glasses (UVA+UVB protection) and wide brim hats (4-inch brim around the entire circumference of the hat) are also recommended for sun protection.  - Call for new or changing lesions. - back  Skin cancer screening performed today.   History  of Basal Cell Carcinoma of the Skin - No evidence of recurrence today - Recommend regular full body skin exams - Recommend daily broad spectrum sunscreen SPF 30+ to sun-exposed areas, reapply every 2 hours as needed.  - Call if any new or changing lesions are noted between office visits  - R nasal tip, Lower sternum, L post auricular neck, R  glabella  Acrochordons (Skin Tags) - Fleshy, skin-colored pedunculated papules - Benign appearing.  - Observe. - If desired, they can be removed with an in office procedure that is not covered by insurance. - Please call the clinic if you notice any new or changing lesions.   Return in about 6 months (around 02/19/2023) for UBSE, Hx of BCC, Hx of AKs, recheck hx of BCC L upper nasal dorsum.  I, [name], RMA, am acting as scribe for [name], MD .  Documentation: I have reviewed the above documentation for accuracy and completeness, and I agree with the above.  [name] MD

## 2022-08-21 NOTE — Patient Instructions (Addendum)

## 2022-10-13 DIAGNOSIS — Z0100 Encounter for examination of eyes and vision without abnormal findings: Secondary | ICD-10-CM | POA: Diagnosis not present

## 2022-10-13 DIAGNOSIS — H43811 Vitreous degeneration, right eye: Secondary | ICD-10-CM | POA: Diagnosis not present

## 2022-10-13 DIAGNOSIS — H2513 Age-related nuclear cataract, bilateral: Secondary | ICD-10-CM | POA: Diagnosis not present

## 2023-01-31 DIAGNOSIS — Z136 Encounter for screening for cardiovascular disorders: Secondary | ICD-10-CM | POA: Diagnosis not present

## 2023-01-31 DIAGNOSIS — I10 Essential (primary) hypertension: Secondary | ICD-10-CM | POA: Diagnosis not present

## 2023-02-07 DIAGNOSIS — I10 Essential (primary) hypertension: Secondary | ICD-10-CM | POA: Diagnosis not present

## 2023-02-07 DIAGNOSIS — Z0000 Encounter for general adult medical examination without abnormal findings: Secondary | ICD-10-CM | POA: Diagnosis not present

## 2023-02-07 DIAGNOSIS — E669 Obesity, unspecified: Secondary | ICD-10-CM | POA: Diagnosis not present

## 2023-02-07 DIAGNOSIS — Z6830 Body mass index (BMI) 30.0-30.9, adult: Secondary | ICD-10-CM | POA: Diagnosis not present

## 2023-02-19 ENCOUNTER — Encounter: Payer: Self-pay | Admitting: Dermatology

## 2023-02-19 ENCOUNTER — Ambulatory Visit: Payer: Medicare HMO | Admitting: Dermatology

## 2023-02-19 VITALS — BP 138/79 | HR 73

## 2023-02-19 DIAGNOSIS — Z85828 Personal history of other malignant neoplasm of skin: Secondary | ICD-10-CM

## 2023-02-19 DIAGNOSIS — D692 Other nonthrombocytopenic purpura: Secondary | ICD-10-CM | POA: Diagnosis not present

## 2023-02-19 DIAGNOSIS — W908XXA Exposure to other nonionizing radiation, initial encounter: Secondary | ICD-10-CM

## 2023-02-19 DIAGNOSIS — L738 Other specified follicular disorders: Secondary | ICD-10-CM

## 2023-02-19 DIAGNOSIS — L821 Other seborrheic keratosis: Secondary | ICD-10-CM

## 2023-02-19 DIAGNOSIS — L814 Other melanin hyperpigmentation: Secondary | ICD-10-CM

## 2023-02-19 DIAGNOSIS — Z1283 Encounter for screening for malignant neoplasm of skin: Secondary | ICD-10-CM

## 2023-02-19 DIAGNOSIS — D1801 Hemangioma of skin and subcutaneous tissue: Secondary | ICD-10-CM | POA: Diagnosis not present

## 2023-02-19 DIAGNOSIS — C44311 Basal cell carcinoma of skin of nose: Secondary | ICD-10-CM

## 2023-02-19 DIAGNOSIS — L578 Other skin changes due to chronic exposure to nonionizing radiation: Secondary | ICD-10-CM

## 2023-02-19 DIAGNOSIS — X32XXXA Exposure to sunlight, initial encounter: Secondary | ICD-10-CM

## 2023-02-19 DIAGNOSIS — D489 Neoplasm of uncertain behavior, unspecified: Secondary | ICD-10-CM

## 2023-02-19 NOTE — Patient Instructions (Addendum)
Biopsy Wound Care Instructions  Leave the original bandage on for 24 hours if possible.  If the bandage becomes soaked or soiled before that time, it is OK to remove it and examine the wound.  A small amount of post-operative bleeding is normal.  If excessive bleeding occurs, remove the bandage, place gauze over the site and apply continuous pressure (no peeking) over the area for 30 minutes. If this does not work, please call our clinic as soon as possible or page your doctor if it is after hours.   Once a day, cleanse the wound with soap and water. It is fine to shower. If a thick crust develops you may use a Q-tip dipped into dilute hydrogen peroxide (mix 1:1 with water) to dissolve it.  Hydrogen peroxide can slow the healing process, so use it only as needed.    After washing, apply petroleum jelly (Vaseline) or an antibiotic ointment if your doctor prescribed one for you, followed by a bandage.    For best healing, the wound should be covered with a layer of ointment at all times. If you are not able to keep the area covered with a bandage to hold the ointment in place, this may mean re-applying the ointment several times a day.  Continue this wound care until the wound has healed and is no longer open.   Itching and mild discomfort is normal during the healing process. However, if you develop pain or severe itching, please call our office.   If you have any discomfort, you can take Tylenol (acetaminophen) or ibuprofen as directed on the bottle. (Please do not take these if you have an allergy to them or cannot take them for another reason).  Some redness, tenderness and white or yellow material in the wound is normal healing.  If the area becomes very sore and red, or develops a thick yellow-green material (pus), it may be infected; please notify us.    If you have stitches, return to clinic as directed to have the stitches removed. You will continue wound care for 2-3 days after the stitches  are removed.   Wound healing continues for up to one year following surgery. It is not unusual to experience pain in the scar from time to time during the interval.  If the pain becomes severe or the scar thickens, you should notify the office.    A slight amount of redness in a scar is expected for the first six months.  After six months, the redness will fade and the scar will soften and fade.  The color difference becomes less noticeable with time.  If there are any problems, return for a post-op surgery check at your earliest convenience.  To improve the appearance of the scar, you can use silicone scar gel, cream, or sheets (such as Mederma or Serica) every night for up to one year. These are available over the counter (without a prescription).  Please call our office at [phone] for any questions or concerns.       Seborrheic Keratosis  What causes seborrheic keratoses? Seborrheic keratoses are harmless, common skin growths that first appear during adult life.  As time goes by, more growths appear.  Some people may develop a large number of them.  Seborrheic keratoses appear on both covered and uncovered body parts.  They are not caused by sunlight.  The tendency to develop seborrheic keratoses can be inherited.  They vary in color from skin-colored to gray, brown, or even black.    They can be either smooth or have a rough, warty surface.   Seborrheic keratoses are superficial and look as if they were stuck on the skin.  Under the microscope this type of keratosis looks like layers upon layers of skin.  That is why at times the top layer may seem to fall off, but the rest of the growth remains and re-grows.    Treatment Seborrheic keratoses do not need to be treated, but can easily be removed in the office.  Seborrheic keratoses often cause symptoms when they rub on clothing or jewelry.  Lesions can be in the way of shaving.  If they become inflamed, they can cause itching, soreness,  or burning.  Removal of a seborrheic keratosis can be accomplished by freezing, burning, or surgery. If any spot bleeds, scabs, or grows rapidly, please return to have it checked, as these can be an indication of a skin cancer.      Melanoma ABCDEs  Melanoma is the most dangerous type of skin cancer, and is the leading cause of death from skin disease.  You are more likely to develop melanoma if you: Have light-colored skin, light-colored eyes, or red or blond hair Spend a lot of time in the sun Tan regularly, either outdoors or in a tanning bed Have had blistering sunburns, especially during childhood Have a close family member who has had a melanoma Have atypical moles or large birthmarks  Early detection of melanoma is key since treatment is typically straightforward and cure rates are extremely high if we catch it early.   The first sign of melanoma is often a change in a mole or a new dark spot.  The ABCDE system is a way of remembering the signs of melanoma.  A for asymmetry:  The two halves do not match. B for border:  The edges of the growth are irregular. C for color:  A mixture of colors are present instead of an even brown color. D for diameter:  Melanomas are usually (but not always) greater than 6mm - the size of a pencil eraser. E for evolution:  The spot keeps changing in size, shape, and color.  Please check your skin once per month between visits. You can use a small mirror in front and a large mirror behind you to keep an eye on the back side or your body.   If you see any new or changing lesions before your next follow-up, please call to schedule a visit.  Please continue daily skin protection including broad spectrum sunscreen SPF 30+ to sun-exposed areas, reapplying every 2 hours as needed when you're outdoors.   Staying in the shade or wearing long sleeves, sun glasses (UVA+UVB protection) and wide brim hats (4-inch brim around the entire circumference of the  hat) are also recommended for sun protection.    Due to recent changes in healthcare laws, you may see results of your pathology and/or laboratory studies on MyChart before the doctors have had a chance to review them. We understand that in some cases there may be results that are confusing or concerning to you. Please understand that not all results are received at the same time and often the doctors may need to interpret multiple results in order to provide you with the best plan of care or course of treatment. Therefore, we ask that you please give us 2 business days to thoroughly review all your results before contacting the office for clarification. Should we see a critical lab result,   you will be contacted sooner.   If You Need Anything After Your Visit  If you have any questions or concerns for your doctor, please call our main line at [phone] and press option 4 to reach your doctor's medical assistant. If no one answers, please leave a voicemail as directed and we will return your call as soon as possible. Messages left after 4 pm will be answered the following business day.   You may also send us a message via MyChart. We typically respond to MyChart messages within 1-2 business days.  For prescription refills, please ask your pharmacy to contact our office. Our fax number is [phone].  If you have an urgent issue when the clinic is closed that cannot wait until the next business day, you can page your doctor at the number below.    Please note that while we do our best to be available for urgent issues outside of office hours, we are not available 24/7.   If you have an urgent issue and are unable to reach us, you may choose to seek medical care at your doctor's office, retail clinic, urgent care center, or emergency room.  If you have a medical emergency, please immediately call 911 or go to the emergency department.  Pager Numbers  - Dr. Kowalski: [phone]  - Dr.  Moye: [phone]  - Dr. Stewart: [phone]  In the event of inclement weather, please call our main line at [phone] for an update on the status of any delays or closures.  Dermatology Medication Tips: Please keep the boxes that topical medications come in in order to help keep track of the instructions about where and how to use these. Pharmacies typically print the medication instructions only on the boxes and not directly on the medication tubes.   If your medication is too expensive, please contact our office at [phone] option 4 or send us a message through MyChart.   We are unable to tell what your co-pay for medications will be in advance as this is different depending on your insurance coverage. However, we may be able to find a substitute medication at lower cost or fill out paperwork to get insurance to cover a needed medication.   If a prior authorization is required to get your medication covered by your insurance company, please allow us 1-2 business days to complete this process.  Drug prices often vary depending on where the prescription is filled and some pharmacies may offer cheaper prices.  The website www.goodrx.com contains coupons for medications through different pharmacies. The prices here do not account for what the cost may be with help from insurance (it may be cheaper with your insurance), but the website can give you the price if you did not use any insurance.  - You can print the associated coupon and take it with your prescription to the pharmacy.  - You may also stop by our office during regular business hours and pick up a GoodRx coupon card.  - If you need your prescription sent electronically to a different pharmacy, notify our office through [facility] MyChart or by phone at [phone] option 4.     Si Usted Necesita Algo Despus de Su Visita  Tambin puede enviarnos un mensaje a travs de MyChart. Por lo general respondemos a los mensajes  de MyChart en el transcurso de 1 a 2 das hbiles.  Para renovar recetas, por favor pida a su farmacia que se ponga en contacto con nuestra oficina. Nuestro nmero   de fax es el [phone].  Si tiene un asunto urgente cuando la clnica est cerrada y que no puede esperar hasta el siguiente da hbil, puede llamar/localizar a su doctor(a) al nmero que aparece a continuacin.   Por favor, tenga en cuenta que aunque hacemos todo lo posible para estar disponibles para asuntos urgentes fuera del horario de oficina, no estamos disponibles las 24 horas del da, los 7 das de la semana.   Si tiene un problema urgente y no puede comunicarse con nosotros, puede optar por buscar atencin mdica  en el consultorio de su doctor(a), en una clnica privada, en un centro de atencin urgente o en una sala de emergencias.  Si tiene una emergencia mdica, por favor llame inmediatamente al 911 o vaya a la sala de emergencias.  Nmeros de bper  - Dr. Kowalski: [phone]  - Dra. Moye: [phone]  - Dra. Stewart: [phone]  En caso de inclemencias del tiempo, por favor llame a nuestra lnea principal al [phone] para una actualizacin sobre el estado de cualquier retraso o cierre.  Consejos para la medicacin en dermatologa: Por favor, guarde las cajas en las que vienen los medicamentos de uso tpico para ayudarle a seguir las instrucciones sobre dnde y cmo usarlos. Las farmacias generalmente imprimen las instrucciones del medicamento slo en las cajas y no directamente en los tubos del medicamento.   Si su medicamento es muy caro, por favor, pngase en contacto con nuestra oficina llamando al [phone] y presione la opcin 4 o envenos un mensaje a travs de MyChart.   No podemos decirle cul ser su copago por los medicamentos por adelantado ya que esto es diferente dependiendo de la cobertura de su seguro. Sin embargo, es posible que podamos encontrar un medicamento sustituto a menor  costo o llenar un formulario para que el seguro cubra el medicamento que se considera necesario.   Si se requiere una autorizacin previa para que su compaa de seguros cubra su medicamento, por favor permtanos de 1 a 2 das hbiles para completar este proceso.  Los precios de los medicamentos varan con frecuencia dependiendo del lugar de dnde se surte la receta y alguna farmacias pueden ofrecer precios ms baratos.  El sitio web www.goodrx.com tiene cupones para medicamentos de diferentes farmacias. Los precios aqu no tienen en cuenta lo que podra costar con la ayuda del seguro (puede ser ms barato con su seguro), pero el sitio web puede darle el precio si no utiliz ningn seguro.  - Puede imprimir el cupn correspondiente y llevarlo con su receta a la farmacia.  - Tambin puede pasar por nuestra oficina durante el horario de atencin regular y recoger una tarjeta de cupones de GoodRx.  - Si necesita que su receta se enve electrnicamente a una farmacia diferente, informe a nuestra oficina a travs de MyChart de [facility] o por telfono llamando al [phone] y presione la opcin 4.  

## 2023-02-19 NOTE — Progress Notes (Signed)
Follow-Up Visit   Subjective  Caleb Maldonado is a 87 y.o. male who presents for the following: Skin Cancer Screening and Upper Body Skin Exam hx of bcc, hx of isk, hx of aks, hx of warts at left hand.  Concerned with bruising at arms   The patient presents for Upper Body Skin Exam (UBSE) for skin cancer screening and mole check. The patient has spots, moles and lesions to be evaluated, some may be new or changing and the patient has concerns that these could be cancer.    The following portions of the chart were reviewed this encounter and updated as appropriate: medications, allergies, medical history  Review of Systems:  No other skin or systemic complaints except as noted in HPI or Assessment and Plan.  Objective  Well appearing patient in no apparent distress; mood and affect are within normal limits.  All skin waist up examined. Relevant physical exam findings are noted in the Assessment and Plan.  left upper nasal dorsum 0.9 mm pink white plaque with telangectasia        Assessment & Plan   Neoplasm of uncertain behavior left upper nasal dorsum  Skin / nail biopsy Type of biopsy: tangential   Informed consent: discussed and consent obtained   Patient was prepped and draped in usual sterile fashion: Area prepped with alcohol. Anesthesia: the lesion was anesthetized in a standard fashion   Anesthetic:  1% lidocaine w/ epinephrine 1-100,000 buffered w/ 8.4% NaHCO3 Instrument used: flexible razor blade   Hemostasis achieved with: pressure, aluminum chloride and electrodesiccation   Outcome: patient tolerated procedure well   Post-procedure details: wound care instructions given   Post-procedure details comment:  Ointment and small bandage applied  Specimen 1 - Surgical pathology Differential Diagnosis: r/o recurrent bcc vs scc  Check Margins: yes  R/o recurrent BCC vs scar   Will consider Mohs if BCC, but patient does not want to drive to [place].  Highly  recommend Mohs Surgery since it is recurrent.   Lentigines, Seborrheic Keratoses at right arm, Hemangiomas - Benign normal skin lesions - Benign-appearing - Call for any changes  Melanocytic Nevi - Tan-brown and/or pink-flesh-colored symmetric macules and papules - Benign appearing on exam today - Observation - Call clinic for new or changing moles - Recommend daily use of broad spectrum spf 30+ sunscreen to sun-exposed areas.   Purpura - Chronic; persistent and recurrent.  Treatable, but not curable. Patient is taking aspirin daily  - Violaceous macules and patches - Benign - Related to trauma, age, sun damage and/or use of blood thinners, chronic use of topical and/or oral steroids - Observe - Can use OTC arnica containing moisturizer such as Dermend Bruise Formula if desired - Call for worsening or other concerns  Sebaceous Hyperplasia - Small yellow papules with a central dell - Benign-appearing - Observe. Call for changes.   Actinic Damage - Chronic condition, secondary to cumulative UV/sun exposure - diffuse scaly erythematous macules with underlying dyspigmentation - Recommend daily broad spectrum sunscreen SPF 30+ to sun-exposed areas, reapply every 2 hours as needed.  - Staying in the shade or wearing long sleeves, sun glasses (UVA+UVB protection) and wide brim hats (4-inch brim around the entire circumference of the hat) are also recommended for sun protection.  - Call for new or changing lesions.  HISTORY OF BASAL CELL CARCINOMA OF THE SKIN Multiple sites see history,   Right glabella 11/22 ED&C - No evidence of recurrence today- R nasal tip, Lower sternum, L  post auricular neck, R glabella - Recommend regular full body skin exams - Recommend daily broad spectrum sunscreen SPF 30+ to sun-exposed areas, reapply every 2 hours as needed.  - Call if any new or changing lesions are noted between office visits   Skin cancer screening performed today.  Return in  about 6 months (around 08/22/2023) for ubse hx of bcc.  I, Asher Muir, CMA, am acting as scribe for Willeen Niece, MD.   Documentation: I have reviewed the above documentation for accuracy and completeness, and I agree with the above.  Willeen Niece, MD

## 2023-02-27 ENCOUNTER — Other Ambulatory Visit: Payer: Self-pay

## 2023-02-27 ENCOUNTER — Telehealth: Payer: Self-pay

## 2023-02-27 DIAGNOSIS — C44311 Basal cell carcinoma of skin of nose: Secondary | ICD-10-CM

## 2023-02-27 NOTE — Telephone Encounter (Signed)
-----   Message from Willeen Niece, MD sent at 02/27/2023  1:11 PM EDT ----- Skin , left upper nasal dorsum inferior to scar BASAL CELL CARCINOMA WITH FIBROSIS, DEEP MARGIN INVOLVED, SEE DESCRIPTION  BCC skin cancer, recurrent.  This needs Mohs surgery to remove completely.  Pt prefers [facility]  - please call patient

## 2023-02-27 NOTE — Progress Notes (Signed)
Referral sent to skin surgery center °

## 2023-02-27 NOTE — Telephone Encounter (Signed)
Advised patient's wife, Darel Hong, biopsy of the left upper nasal dorsum inferior to scar was recurrent BCC. This needs Mohs surgery to completely remove. Will send referral to [facility].

## 2023-04-16 DIAGNOSIS — C44311 Basal cell carcinoma of skin of nose: Secondary | ICD-10-CM | POA: Diagnosis not present

## 2023-05-02 ENCOUNTER — Telehealth: Payer: Self-pay

## 2023-05-02 NOTE — Telephone Encounter (Signed)
Specimen tracking updated s/p Mohs, JS

## 2023-05-30 DIAGNOSIS — H6123 Impacted cerumen, bilateral: Secondary | ICD-10-CM | POA: Diagnosis not present

## 2023-06-15 DIAGNOSIS — T161XXA Foreign body in right ear, initial encounter: Secondary | ICD-10-CM | POA: Diagnosis not present

## 2023-06-15 DIAGNOSIS — T162XXA Foreign body in left ear, initial encounter: Secondary | ICD-10-CM | POA: Diagnosis not present

## 2023-06-15 DIAGNOSIS — H6123 Impacted cerumen, bilateral: Secondary | ICD-10-CM | POA: Diagnosis not present

## 2023-08-03 DIAGNOSIS — I10 Essential (primary) hypertension: Secondary | ICD-10-CM | POA: Diagnosis not present

## 2023-08-10 DIAGNOSIS — E876 Hypokalemia: Secondary | ICD-10-CM | POA: Diagnosis not present

## 2023-08-10 DIAGNOSIS — Z1331 Encounter for screening for depression: Secondary | ICD-10-CM | POA: Diagnosis not present

## 2023-08-10 DIAGNOSIS — Z0000 Encounter for general adult medical examination without abnormal findings: Secondary | ICD-10-CM | POA: Diagnosis not present

## 2023-08-10 DIAGNOSIS — I10 Essential (primary) hypertension: Secondary | ICD-10-CM | POA: Diagnosis not present

## 2023-08-10 DIAGNOSIS — Z136 Encounter for screening for cardiovascular disorders: Secondary | ICD-10-CM | POA: Diagnosis not present

## 2023-08-28 ENCOUNTER — Ambulatory Visit: Payer: Medicare HMO | Admitting: Dermatology

## 2023-09-19 ENCOUNTER — Ambulatory Visit: Payer: Medicare HMO | Admitting: Dermatology

## 2023-10-15 DIAGNOSIS — H2513 Age-related nuclear cataract, bilateral: Secondary | ICD-10-CM | POA: Diagnosis not present

## 2023-10-15 DIAGNOSIS — H43813 Vitreous degeneration, bilateral: Secondary | ICD-10-CM | POA: Diagnosis not present

## 2023-10-31 ENCOUNTER — Encounter: Payer: Self-pay | Admitting: Dermatology

## 2023-10-31 ENCOUNTER — Ambulatory Visit: Payer: Medicare HMO | Admitting: Dermatology

## 2023-10-31 DIAGNOSIS — L918 Other hypertrophic disorders of the skin: Secondary | ICD-10-CM

## 2023-10-31 DIAGNOSIS — Z85828 Personal history of other malignant neoplasm of skin: Secondary | ICD-10-CM

## 2023-10-31 DIAGNOSIS — L578 Other skin changes due to chronic exposure to nonionizing radiation: Secondary | ICD-10-CM | POA: Diagnosis not present

## 2023-10-31 DIAGNOSIS — W908XXA Exposure to other nonionizing radiation, initial encounter: Secondary | ICD-10-CM

## 2023-10-31 DIAGNOSIS — L739 Follicular disorder, unspecified: Secondary | ICD-10-CM | POA: Diagnosis not present

## 2023-10-31 DIAGNOSIS — L821 Other seborrheic keratosis: Secondary | ICD-10-CM | POA: Diagnosis not present

## 2023-10-31 DIAGNOSIS — D229 Melanocytic nevi, unspecified: Secondary | ICD-10-CM

## 2023-10-31 DIAGNOSIS — Z1283 Encounter for screening for malignant neoplasm of skin: Secondary | ICD-10-CM

## 2023-10-31 DIAGNOSIS — L814 Other melanin hyperpigmentation: Secondary | ICD-10-CM | POA: Diagnosis not present

## 2023-10-31 DIAGNOSIS — D1801 Hemangioma of skin and subcutaneous tissue: Secondary | ICD-10-CM

## 2023-10-31 DIAGNOSIS — Z872 Personal history of diseases of the skin and subcutaneous tissue: Secondary | ICD-10-CM | POA: Diagnosis not present

## 2023-10-31 NOTE — Progress Notes (Signed)
Follow-Up Visit   Subjective  Caleb Maldonado is a 88 y.o. male who presents for the following: Skin Cancer Screening and Upper Body Skin Exam, hx of BCCs, AKs  The patient presents for Upper Body Skin Exam (UBSE) for skin cancer screening and mole check. The patient has spots, moles and lesions to be evaluated, some may be new or changing and the patient may have concern these could be cancer.    The following portions of the chart were reviewed this encounter and updated as appropriate: medications, allergies, medical history  Review of Systems:  No other skin or systemic complaints except as noted in HPI or Assessment and Plan.  Objective  Well appearing patient in no apparent distress; mood and affect are within normal limits.  All skin waist up examined. Relevant physical exam findings are noted in the Assessment and Plan.    Assessment & Plan    Skin cancer screening performed today.  Actinic Damage - Chronic condition, secondary to cumulative UV/sun exposure - diffuse scaly erythematous macules with underlying dyspigmentation - Recommend daily broad spectrum sunscreen SPF 30+ to sun-exposed areas, reapply every 2 hours as needed.  - Staying in the shade or wearing long sleeves, sun glasses (UVA+UVB protection) and wide brim hats (4-inch brim around the entire circumference of the hat) are also recommended for sun protection.  - Call for new or changing lesions.  Lentigines, Seborrheic Keratoses, Hemangiomas - Benign normal skin lesions - Benign-appearing - Call for any changes  Melanocytic Nevi - Tan-brown and/or pink-flesh-colored symmetric macules and papules - Benign appearing on exam today - Observation - Call clinic for new or changing moles - Recommend daily use of broad spectrum spf 30+ sunscreen to sun-exposed areas.   HISTORY OF BASAL CELL CARCINOMA OF THE SKIN - No evidence of recurrence today- L upper nasal dorsum, R glabella, Lower sternum, L post  auricular neck, R nasal tip - Recommend regular full body skin exams - Recommend daily broad spectrum sunscreen SPF 30+ to sun-exposed areas, reapply every 2 hours as needed.  - Call if any new or changing lesions are noted between office visits    FOLLICULITIS Neck, shoulders Exam: Perifollicular erythematous papules and pustules, neck, shoulders  Folliculitis occurs due to inflammation of the superficial hair follicle (pore), resulting in acne-like lesions (pus bumps). It can be infectious (bacterial, fungal) or noninfectious (shaving, tight clothing, heat/sweat, medications).  Folliculitis can be acute or chronic and recommended treatment depends on the underlying cause of folliculitis.  Treatment Plan: Benign, no treatment, not bothersome to patient  HISTORY OF PRECANCEROUS ACTINIC KERATOSIS - site(s) of PreCancerous Actinic Keratosis clear today. - these may recur and new lesions may form requiring treatment to prevent transformation into skin cancer - observe for new or changing spots and contact [facility] for appointment if occur - photoprotection with sun protective clothing; sunglasses and broad spectrum sunscreen with SPF of at least 30 + and frequent self skin exams recommended - yearly exams by a dermatologist recommended for persons with history of PreCancerous Actinic Keratoses   Acrochordons (Skin Tags) - Fleshy, skin-colored pedunculated papules - Benign appearing.  - Observe. - If desired, they can be removed with an in office procedure that is not covered by insurance. - Please call the clinic if you notice any new or changing lesions.   Return in about 6 months (around 04/29/2024) for AK f/u, BCC f/u.  I, Ardis Rowan, RMA, am acting as scribe for Willeen Niece, MD .  Documentation: I have reviewed the above documentation for accuracy and completeness, and I agree with the above.  Willeen Niece, MD

## 2023-10-31 NOTE — Patient Instructions (Signed)

## 2023-11-29 DIAGNOSIS — H259 Unspecified age-related cataract: Secondary | ICD-10-CM | POA: Diagnosis not present

## 2023-11-29 DIAGNOSIS — G629 Polyneuropathy, unspecified: Secondary | ICD-10-CM | POA: Diagnosis not present

## 2023-11-29 DIAGNOSIS — I10 Essential (primary) hypertension: Secondary | ICD-10-CM | POA: Diagnosis not present

## 2023-11-29 DIAGNOSIS — Z87891 Personal history of nicotine dependence: Secondary | ICD-10-CM | POA: Diagnosis not present

## 2023-11-30 DIAGNOSIS — E876 Hypokalemia: Secondary | ICD-10-CM | POA: Diagnosis not present

## 2023-11-30 DIAGNOSIS — I10 Essential (primary) hypertension: Secondary | ICD-10-CM | POA: Diagnosis not present

## 2024-02-01 DIAGNOSIS — Z136 Encounter for screening for cardiovascular disorders: Secondary | ICD-10-CM | POA: Diagnosis not present

## 2024-02-01 DIAGNOSIS — I10 Essential (primary) hypertension: Secondary | ICD-10-CM | POA: Diagnosis not present

## 2024-02-08 DIAGNOSIS — I10 Essential (primary) hypertension: Secondary | ICD-10-CM | POA: Diagnosis not present

## 2024-02-08 DIAGNOSIS — Z0000 Encounter for general adult medical examination without abnormal findings: Secondary | ICD-10-CM | POA: Diagnosis not present

## 2024-04-07 DIAGNOSIS — L600 Ingrowing nail: Secondary | ICD-10-CM | POA: Diagnosis not present

## 2024-04-07 DIAGNOSIS — B351 Tinea unguium: Secondary | ICD-10-CM | POA: Diagnosis not present

## 2024-04-07 DIAGNOSIS — M79674 Pain in right toe(s): Secondary | ICD-10-CM | POA: Diagnosis not present

## 2024-04-07 DIAGNOSIS — M79675 Pain in left toe(s): Secondary | ICD-10-CM | POA: Diagnosis not present

## 2024-04-29 ENCOUNTER — Encounter: Payer: Self-pay | Admitting: Dermatology

## 2024-04-29 ENCOUNTER — Ambulatory Visit: Payer: PPO | Admitting: Dermatology

## 2024-04-29 DIAGNOSIS — S1081XA Abrasion of other specified part of neck, initial encounter: Secondary | ICD-10-CM | POA: Diagnosis not present

## 2024-04-29 DIAGNOSIS — L821 Other seborrheic keratosis: Secondary | ICD-10-CM | POA: Diagnosis not present

## 2024-04-29 DIAGNOSIS — Z1283 Encounter for screening for malignant neoplasm of skin: Secondary | ICD-10-CM

## 2024-04-29 DIAGNOSIS — T148XXA Other injury of unspecified body region, initial encounter: Secondary | ICD-10-CM

## 2024-04-29 DIAGNOSIS — Z85828 Personal history of other malignant neoplasm of skin: Secondary | ICD-10-CM

## 2024-04-29 DIAGNOSIS — Z872 Personal history of diseases of the skin and subcutaneous tissue: Secondary | ICD-10-CM

## 2024-04-29 DIAGNOSIS — D1801 Hemangioma of skin and subcutaneous tissue: Secondary | ICD-10-CM | POA: Diagnosis not present

## 2024-04-29 DIAGNOSIS — L578 Other skin changes due to chronic exposure to nonionizing radiation: Secondary | ICD-10-CM

## 2024-04-29 DIAGNOSIS — L814 Other melanin hyperpigmentation: Secondary | ICD-10-CM | POA: Diagnosis not present

## 2024-04-29 DIAGNOSIS — W908XXA Exposure to other nonionizing radiation, initial encounter: Secondary | ICD-10-CM | POA: Diagnosis not present

## 2024-04-29 DIAGNOSIS — D229 Melanocytic nevi, unspecified: Secondary | ICD-10-CM

## 2024-04-29 DIAGNOSIS — L918 Other hypertrophic disorders of the skin: Secondary | ICD-10-CM | POA: Diagnosis not present

## 2024-04-29 NOTE — Progress Notes (Signed)
   Follow-Up Visit   Subjective  Caleb Maldonado is a 88 y.o. male who presents for the following: Skin Cancer Screening and Upper Body Skin Exam  The patient presents for Upper Body Skin Exam (UBSE) for skin cancer screening and mole check. The patient has spots, moles and lesions to be evaluated, some may be new or changing. History of BCCs and AKs. Pt no longer goes outside much.  This patient is accompanied in the office by his spouse.  The following portions of the chart were reviewed this encounter and updated as appropriate: medications, allergies, medical history  Review of Systems:  No other skin or systemic complaints except as noted in HPI or Assessment and Plan.  Objective  Well appearing patient in no apparent distress; mood and affect are within normal limits.  All skin waist up and lower legs examined. Relevant physical exam findings are noted in the Assessment and Plan.    Assessment & Plan    Skin cancer screening performed today.  Actinic Damage - Chronic condition, secondary to cumulative UV/sun exposure - diffuse scaly erythematous macules with underlying dyspigmentation - Recommend daily broad spectrum sunscreen SPF 30+ to sun-exposed areas, reapply every 2 hours as needed.  - Staying in the shade or wearing long sleeves, sun glasses (UVA+UVB protection) and wide brim hats (4-inch brim around the entire circumference of the hat) are also recommended for sun protection.  - Call for new or changing lesions.  Lentigines, Seborrheic Keratoses, Hemangiomas - Benign normal skin lesions - Benign-appearing - Call for any changes  Melanocytic Nevi - Tan-brown and/or pink-flesh-colored symmetric macules and papules - Benign appearing on exam today - Observation - Call clinic for new or changing moles - Recommend daily use of broad spectrum spf 30+ sunscreen to sun-exposed areas.   HISTORY OF BASAL CELL CARCINOMA OF THE SKIN - No evidence of recurrence today- L  upper nasal dorsum, R glabella, Lower sternum, L post auricular neck, R nasal tip, R nasolabial - Recommend regular full body skin exams - Recommend daily broad spectrum sunscreen SPF 30+ to sun-exposed areas, reapply every 2 hours as needed.  - Call if any new or changing lesions are noted between office visits    Acrochordons (Skin Tags) - Fleshy, skin-colored pedunculated papules - Benign appearing.  - Observe. - If desired, they can be removed with an in office procedure that is not covered by insurance. - Please call the clinic if you notice any new or changing lesions.  EXCORIATIONS Exam: Excoriations at right posterior lower neck  Treatment Plan: Recommend vaseline. Call if not resolving. Discussed Rx to help with itching, defers today. Will continue otc anti-itch cream.  HISTORY OF PRECANCEROUS ACTINIC KERATOSIS - site(s) of PreCancerous Actinic Keratosis clear today. - these may recur and new lesions may form requiring treatment to prevent transformation into skin cancer - observe for new or changing spots and contact [facility] for appointment if occur - photoprotection with sun protective clothing; sunglasses and broad spectrum sunscreen with SPF of at least 30 + and frequent self skin exams recommended - yearly exams by a dermatologist recommended for persons with history of PreCancerous Actinic Keratoses  Return in about 1 year (around 04/29/2025) for UBSE, Hx BCC, Hx AKs.  IAndrea Kerns, CMA, am acting as scribe for Rexene Rattler, MD .   Documentation: I have reviewed the above documentation for accuracy and completeness, and I agree with the above.  Rexene Rattler, MD

## 2024-04-29 NOTE — Patient Instructions (Signed)

## 2024-04-30 ENCOUNTER — Emergency Department
Admission: EM | Admit: 2024-04-30 | Discharge: 2024-04-30 | Disposition: A | Discharge status: Home / Self Care | Attending: Emergency Medicine | Admitting: Emergency Medicine

## 2024-04-30 ENCOUNTER — Other Ambulatory Visit: Payer: Self-pay

## 2024-04-30 ENCOUNTER — Emergency Department

## 2024-04-30 DIAGNOSIS — K8020 Calculus of gallbladder without cholecystitis without obstruction: Secondary | ICD-10-CM | POA: Diagnosis not present

## 2024-04-30 DIAGNOSIS — N132 Hydronephrosis with renal and ureteral calculous obstruction: Secondary | ICD-10-CM | POA: Insufficient documentation

## 2024-04-30 DIAGNOSIS — N401 Enlarged prostate with lower urinary tract symptoms: Secondary | ICD-10-CM | POA: Diagnosis not present

## 2024-04-30 DIAGNOSIS — N200 Calculus of kidney: Secondary | ICD-10-CM

## 2024-04-30 DIAGNOSIS — I10 Essential (primary) hypertension: Secondary | ICD-10-CM | POA: Diagnosis not present

## 2024-04-30 DIAGNOSIS — R109 Unspecified abdominal pain: Secondary | ICD-10-CM | POA: Diagnosis not present

## 2024-04-30 DIAGNOSIS — R1084 Generalized abdominal pain: Secondary | ICD-10-CM | POA: Diagnosis not present

## 2024-04-30 LAB — CBC
HCT: 51.5 % (ref 39.0–52.0)
Hemoglobin: 17.9 g/dL — ABNORMAL HIGH (ref 13.0–17.0)
MCH: 30.1 pg (ref 26.0–34.0)
MCHC: 34.8 g/dL (ref 30.0–36.0)
MCV: 86.7 fL (ref 80.0–100.0)
Platelets: 339 K/uL (ref 150–400)
RBC: 5.94 MIL/uL — ABNORMAL HIGH (ref 4.22–5.81)
RDW: 13.7 % (ref 11.5–15.5)
WBC: 18.3 K/uL — ABNORMAL HIGH (ref 4.0–10.5)
nRBC: 0 % (ref 0.0–0.2)

## 2024-04-30 LAB — URINALYSIS, ROUTINE W REFLEX MICROSCOPIC
Bacteria, UA: NONE SEEN
Bilirubin Urine: NEGATIVE
Glucose, UA: 50 mg/dL — AB
Ketones, ur: 20 mg/dL — AB
Leukocytes,Ua: NEGATIVE
Nitrite: NEGATIVE
Protein, ur: 100 mg/dL — AB
RBC / HPF: >50 RBC/hpf (ref 0–5)
Specific Gravity, Urine: 1.021 (ref 1.005–1.030)
Squamous Epithelial / HPF: 0 /HPF (ref 0–5)
pH: 5 (ref 5.0–8.0)

## 2024-04-30 LAB — LIPASE, BLOOD: Lipase: 26 U/L (ref 11–51)

## 2024-04-30 LAB — COMPREHENSIVE METABOLIC PANEL WITH GFR
ALT: 47 U/L — ABNORMAL HIGH (ref 0–44)
AST: 38 U/L (ref 15–41)
Albumin: 4.1 g/dL (ref 3.5–5.0)
Alkaline Phosphatase: 92 U/L (ref 38–126)
Anion gap: 13 (ref 5–15)
BUN: 20 mg/dL (ref 8–23)
CO2: 26 mmol/L (ref 22–32)
Calcium: 9.7 mg/dL (ref 8.9–10.3)
Chloride: 103 mmol/L (ref 98–111)
Creatinine, Ser: 1.42 mg/dL — ABNORMAL HIGH (ref 0.61–1.24)
GFR, Estimated: 48 mL/min — ABNORMAL LOW (ref >60)
Glucose, Bld: 174 mg/dL — ABNORMAL HIGH (ref 70–99)
Potassium: 3.1 mmol/L — ABNORMAL LOW (ref 3.5–5.1)
Sodium: 142 mmol/L (ref 135–145)
Total Bilirubin: 1.1 mg/dL (ref 0.0–1.2)
Total Protein: 7.6 g/dL (ref 6.5–8.1)

## 2024-04-30 MED ORDER — ONDANSETRON 4 MG PO TBDP: 4.0000 mg | ORAL_TABLET | Freq: Three times a day (TID) | ORAL | 0 refills | Status: DC | PRN

## 2024-04-30 MED ORDER — TAMSULOSIN HCL 0.4 MG PO CAPS
0.4000 mg | ORAL_CAPSULE | Freq: Once | ORAL | Status: AC
  Administered 2024-04-30: 0.4 mg via ORAL
  Filled 2024-04-30: qty 1

## 2024-04-30 MED ORDER — ACETAMINOPHEN 500 MG PO TABS
1000.0000 mg | ORAL_TABLET | Freq: Once | ORAL | Status: AC
  Administered 2024-04-30: 1000 mg via ORAL
  Filled 2024-04-30: qty 2

## 2024-04-30 MED ORDER — IOHEXOL 300 MG/ML  SOLN
100.0000 mL | Freq: Once | INTRAMUSCULAR | Status: AC | PRN
  Administered 2024-04-30: 100 mL via INTRAVENOUS

## 2024-04-30 MED ORDER — OXYCODONE HCL 5 MG PO TABS: 5.0000 mg | ORAL_TABLET | Freq: Three times a day (TID) | ORAL | 0 refills | Status: AC | PRN

## 2024-04-30 MED ORDER — TAMSULOSIN HCL 0.4 MG PO CAPS: 0.4000 mg | ORAL_CAPSULE | Freq: Every day | ORAL | 0 refills | Status: DC

## 2024-04-30 MED ORDER — ONDANSETRON 4 MG PO TBDP
4.0000 mg | ORAL_TABLET | Freq: Once | ORAL | Status: AC
  Administered 2024-04-30: 4 mg via ORAL
  Filled 2024-04-30: qty 1

## 2024-04-30 NOTE — ED Notes (Signed)
 Call made to CT to update on IV placement.

## 2024-04-30 NOTE — ED Provider Notes (Signed)
 [facility] Provider Note    Event Date/Time   First MD Initiated Contact with Patient 04/30/24 2026     (approximate)  History   Chief Complaint: Abdominal Pain  HPI  Caleb Maldonado is a 88 y.o. male with a past medical history of hypertension, presents to the emergency department for abdominal pain.  According to the patient for the last 3 to 4 days he has been experiencing abdominal discomfort, wife states he only started complaining about it this morning when he was nauseated had 1 episode of vomiting.  No diarrhea denies constipation.  No urinary symptoms.  No fever.  Patient saw his doctor today who referred him to the emergency department for abdominal imaging.  Physical Exam   Triage Vital Signs: ED Triage Vitals  Encounter Vitals Group     BP 04/30/24 1659 (!) 156/99     Girls Systolic BP Percentile --      Girls Diastolic BP Percentile --      Boys Systolic BP Percentile --      Boys Diastolic BP Percentile --      Pulse Rate 04/30/24 1659 94     Resp 04/30/24 1659 18     Temp 04/30/24 1659 98 F (36.7 C)     Temp Source 04/30/24 1659 Oral     SpO2 04/30/24 1659 94 %     Weight --      Height --      Head Circumference --      Peak Flow --      Pain Score 04/30/24 1658 4     Pain Loc --      Pain Education --      Exclude from Growth Chart --     Most recent vital signs: Vitals:   04/30/24 1659  BP: (!) 156/99  Pulse: 94  Resp: 18  Temp: 98 F (36.7 C)  SpO2: 94%    General: Awake, no distress.  CV:  Good peripheral perfusion.  Regular rate and rhythm  Resp:  Normal effort.  Equal breath sounds bilaterally.  Abd:  No distention.  Soft, Mild tenderness palpation diffusely but no significant tenderness.  No focal tenderness.  No rebound or guarding.  ED Results / Procedures / Treatments   RADIOLOGY  I have reviewed and interpreted CT scan.  No obvious obstruction or significant abnormality seen on my evaluation. Radiology  is read the CT scan is a 4 mm proximal right ureteral stone.   MEDICATIONS ORDERED IN ED: Medications - No data to display   IMPRESSION / MDM / ASSESSMENT AND PLAN / ED COURSE  I reviewed the triage vital signs and the nursing notes.  Patient's presentation is most consistent with acute presentation with potential threat to life or bodily function.  Patient presents to the emergency department for abdominal pain, unclear exact timeline.  Patient's workup today does show moderate leukocytosis of 18,000.  Patient's chemistry shows renal insufficiency at 1.4, no other significant findings.  Lipase normal.  Urinalysis does show some red blood cells but no white cells or bacteria.  Patient denies any urinary symptoms.  Patient does have mild diffuse tenderness on exam, given the mild tenderness we will obtain CT imaging abdomen/pelvis to further evaluate.  Patient agreeable to plan of care.  Patient's workup today shows moderate leukocytosis, mild renal insufficiency.  Urinalysis shows 50 red cells no other findings.  We will proceed with a CT scan abdomen/pelvis to further evaluate.  CT  scan shows a 4 mm proximal right ureteral stone likely the cause of the patient's symptoms.  Discussed the findings with the patient and family.  Will place the patient on Flomax , Zofran , Percocet have the patient follow-up with urology.  Patient is agreeable to plan of care.  Family is agreeable to this plan as well.  FINAL CLINICAL IMPRESSION(S) / ED DIAGNOSES   Ureterolithiasis  Note:  This document was prepared using Dragon voice recognition software and may include unintentional dictation errors.   Dorothyann Drivers, MD 04/30/24 2255

## 2024-04-30 NOTE — ED Triage Notes (Signed)
 Patient sent over from [facility] for increased abdominal pain over the past 3 days; denies N/V/D and burning with urination.

## 2024-04-30 NOTE — Discharge Instructions (Addendum)
 Your workup is consistent with a right sided kidney stone.  Please take your medications as prescribed.  Please call the number provided for urology to arrange a follow-up appointment soon as possible.  Return to the emergency department for any fever or any other symptom personally concerning to yourself.

## 2024-05-19 ENCOUNTER — Ambulatory Visit: Admitting: Physician Assistant

## 2024-05-19 VITALS — BP 112/58 | HR 97 | Ht 72.0 in | Wt 219.0 lb

## 2024-05-19 DIAGNOSIS — N201 Calculus of ureter: Secondary | ICD-10-CM | POA: Diagnosis not present

## 2024-05-19 LAB — URINALYSIS, COMPLETE
Bilirubin, UA: NEGATIVE
Glucose, UA: NEGATIVE
Leukocytes,UA: NEGATIVE
Nitrite, UA: NEGATIVE
Specific Gravity, UA: 1.025 (ref 1.005–1.030)
Urobilinogen, Ur: 0.2 mg/dL (ref 0.2–1.0)
pH, UA: 6 (ref 5.0–7.5)

## 2024-05-19 LAB — MICROSCOPIC EXAMINATION: RBC, Urine: >30 /HPF — AB (ref 0–2)

## 2024-05-19 MED ORDER — TAMSULOSIN HCL 0.4 MG PO CAPS: 0.4000 mg | ORAL_CAPSULE | Freq: Every day | ORAL | 0 refills | Status: DC

## 2024-05-19 MED ORDER — ONDANSETRON 4 MG PO TBDP: 4.0000 mg | ORAL_TABLET | Freq: Three times a day (TID) | ORAL | 0 refills | Status: AC | PRN

## 2024-05-19 NOTE — Progress Notes (Signed)
 05/19/2024 10:01 AM   [name] 1936/04/14 [id number]  CC: Chief Complaint  Patient presents with   Follow-up   Nephrolithiasis   HPI: Caleb Maldonado is a 88 y.o. male with PMH nephrolithiasis and BPH who presents today for ED follow-up of a 4 mm proximal right ureteral stone.  He is seen today by his wife, who contributes to HPI.  He was seen in the ED on 04/30/2024 with reports of 3 days of abdominal pain and emesis x 1.  CTAP with contrast showed a 4 mm proximal right ureteral stone.  UA was notable only for >50 RBC/hpf.  He was discharged with Zofran , oxycodone , and Flomax .  Today he reports he is largely comfortable, but has not yet seen a stone pass.  He has only needed 1 dose of oxycodone  since leaving the ED.  Notably, he previously spontaneously passed a 10 mm stone to the level of the bladder before ultimately undergoing cystolitholapaxy with Dr. Twylla in 2023.  In-office UA today positive for 1+ protein, 3+ blood, and trace ketones; urine microscopy with >30 RBCs/HPF.  PMH: Past Medical History:  Diagnosis Date   Actinic keratosis    Basal cell carcinoma 12/20/2015   R nasal tip, R nasolabial    Basal cell carcinoma 01/12/2020   L upper nasal dorsum (clear w/bx), recurrent bx/edc 05/02/2021   Basal cell carcinoma 01/12/2020   Lower sternum, EDC 04/05/20    Basal cell carcinoma 02/26/2019   L post auricular neck    Basal cell carcinoma 02/19/2023   left upper nasal dorsum, Mohs 04/16/23   BCC (basal cell carcinoma of skin) 08/02/2021   R glabella, EDC   Hypertension     Surgical History: Past Surgical History:  Procedure Laterality Date   CYSTOSCOPY/URETEROSCOPY/HOLMIUM LASER/STENT PLACEMENT Left 04/25/2022   Procedure: CYSTOSCOPY/HOLMIUM LASER/ RETORGRADE PYLEOGRAM/ LITHOLAPLAXY;  Surgeon: Twylla Glendia BROCKS, MD;  Location: [facility] ORS;  Service: Urology;  Laterality: Left;   POLYPECTOMY  1992    Home Medications:  Allergies as of 05/19/2024   No Known  Allergies      Medication List        Accurate as of May 19, 2024 10:01 AM. If you have any questions, ask your nurse or doctor.          amLODipine 5 MG tablet Commonly known as: NORVASC Take 5 mg by mouth daily.   CENTRUM SILVER PO Take by mouth.   lisinopril  40 MG tablet Commonly known as: ZESTRIL  Take 40 mg by mouth daily.   ondansetron  4 MG disintegrating tablet Commonly known as: ZOFRAN -ODT Take 1 tablet (4 mg total) by mouth every 8 (eight) hours as needed for nausea or vomiting.   oxyCODONE  5 MG immediate release tablet Commonly known as: Roxicodone  Take 1 tablet (5 mg total) by mouth every 8 (eight) hours as needed.   tamsulosin  0.4 MG Caps capsule Commonly known as: FLOMAX  Take 1 capsule (0.4 mg total) by mouth daily.        Allergies:  No Known Allergies  Family History: No family history on file.  Social History:   reports that he has never smoked. He has never been exposed to tobacco smoke. He has never used smokeless tobacco. No history on file for alcohol use and drug use.  Physical Exam: BP (!) 112/58 (BP Location: Left Arm, Patient Position: Sitting, Cuff Size: Normal)   Pulse 97   Ht 6' (1.829 m)   Wt 219 lb (99.3 kg)   SpO2  96%   BMI 29.70 kg/m   Constitutional:  Alert and oriented, no acute distress, nontoxic appearing HEENT: [place], AT Cardiovascular: No clubbing, cyanosis, or edema Respiratory: Normal respiratory effort, no increased work of breathing Skin: No rashes, bruises or suspicious lesions Neurologic: Grossly intact, no focal deficits, moving all 4 extremities Psychiatric: Normal mood and affect  Laboratory Data: Results for orders placed or performed in visit on 05/19/24  Microscopic Examination   Collection Time: 05/19/24 10:07 AM   Urine  Result Value Ref Range   WBC, UA 0-5 0 - 5 /hpf   RBC, Urine >30 (A) 0 - 2 /hpf   Epithelial Cells (non renal) 0-10 0 - 10 /hpf   Mucus, UA Present (A) Not Estab.   Bacteria,  UA Few None seen/Few  Urinalysis, Complete   Collection Time: 05/19/24 10:07 AM  Result Value Ref Range   Specific Gravity, UA 1.025 1.005 - 1.030   pH, UA 6.0 5.0 - 7.5   Color, UA Yellow Yellow   Appearance Ur Clear Clear   Leukocytes,UA Negative Negative   Protein,UA 1+ (A) Negative/Trace   Glucose, UA Negative Negative   Ketones, UA Trace (A) Negative   RBC, UA 3+ (A) Negative   Bilirubin, UA Negative Negative   Urobilinogen, Ur 0.2 0.2 - 1.0 mg/dL   Nitrite, UA Negative Negative   Microscopic Examination See below:    Assessment & Plan:   1. Right ureteral stone (Primary) He is comfortable, but with persistent microscopic hematuria, I suspect he has not yet passed the stone.  I recommended continued trial of passage, as odds are in his favor that he will be able to pass the stone spontaneously.  They are in agreement.  We discussed continuing Flomax  and taking Zofran  and oxycodone  as needed for symptom control.  We gave him a strainer today and I counseled him to stay hydrated.  Will see him back in 3 weeks with KUB prior.  We discussed return precautions including fever, uncontrolled pain, or uncontrolled nausea/vomiting. - Urinalysis, Complete - tamsulosin  (FLOMAX ) 0.4 MG CAPS capsule; Take 1 capsule (0.4 mg total) by mouth daily.  Dispense: 30 capsule; Refill: 0 - ondansetron  (ZOFRAN -ODT) 4 MG disintegrating tablet; Take 1 tablet (4 mg total) by mouth every 8 (eight) hours as needed for nausea or vomiting.  Dispense: 20 tablet; Refill: 0 - DG Abd 1 View; Future   Return in about 3 weeks (around 06/09/2024) for Stone f/u with UA + KUB prior.  Lucie Hones, PA-C  [facility] [address], [address] [place], [place] [address] [phone]

## 2024-05-19 NOTE — Patient Instructions (Signed)
 For the next 3 weeks, please do the following: -Stay well hydrated (64 ounces of fluid daily) -Strain your urine to catch any stones that pass -Take Flomax  0.4mg  daily -Treat any pain with Advil (ibuprofen), Tylenol  (acetaminophen ), or oxycodone  -Treat any nausea with Zofran   I will plan to see you back in clinic in 3 weeks with another x-ray prior to see if you have passed your stone.  Please call our office immediately (we are open 8a-5p Monday-Friday) or go to the Emergency Department if you develop any of the following: -Fever/chills -Nausea and/or vomiting uncontrollable with Zofran  -Pain uncontrollable with oxycodone 

## 2024-05-30 ENCOUNTER — Ambulatory Visit
Admission: RE | Admit: 2024-05-30 | Discharge: 2024-05-30 | Disposition: A | Discharge status: Home / Self Care | Source: Ambulatory Visit | Attending: Physician Assistant | Admitting: Physician Assistant

## 2024-05-30 DIAGNOSIS — N202 Calculus of kidney with calculus of ureter: Secondary | ICD-10-CM | POA: Diagnosis not present

## 2024-05-30 DIAGNOSIS — N201 Calculus of ureter: Secondary | ICD-10-CM | POA: Insufficient documentation

## 2024-06-09 ENCOUNTER — Ambulatory Visit
Admission: RE | Admit: 2024-06-09 | Discharge: 2024-06-09 | Disposition: A | Discharge status: Home / Self Care | Source: Ambulatory Visit | Attending: Physician Assistant | Admitting: Physician Assistant

## 2024-06-09 ENCOUNTER — Encounter: Payer: Self-pay | Admitting: Physician Assistant

## 2024-06-09 ENCOUNTER — Ambulatory Visit: Admitting: Physician Assistant

## 2024-06-09 ENCOUNTER — Other Ambulatory Visit: Payer: Self-pay | Admitting: Physician Assistant

## 2024-06-09 VITALS — BP 160/73 | HR 76 | Ht 72.0 in | Wt 219.0 lb

## 2024-06-09 DIAGNOSIS — N201 Calculus of ureter: Secondary | ICD-10-CM

## 2024-06-09 DIAGNOSIS — N202 Calculus of kidney with calculus of ureter: Secondary | ICD-10-CM | POA: Diagnosis not present

## 2024-06-09 LAB — URINALYSIS, COMPLETE
Bilirubin, UA: NEGATIVE
Glucose, UA: NEGATIVE
Ketones, UA: NEGATIVE
Leukocytes,UA: NEGATIVE
Nitrite, UA: NEGATIVE
Specific Gravity, UA: 1.02 (ref 1.005–1.030)
Urobilinogen, Ur: 1 mg/dL (ref 0.2–1.0)
pH, UA: 6 (ref 5.0–7.5)

## 2024-06-09 LAB — MICROSCOPIC EXAMINATION

## 2024-06-09 NOTE — Progress Notes (Signed)
 06/09/2024 9:56 AM   Caleb Maldonado October 31, 1935 [id number]  CC: Chief Complaint  Patient presents with   Nephrolithiasis   HPI: Caleb Maldonado is a 88 y.o. male with PMH nephrolithiasis and BPH who presents today for follow-up of a 4 mm proximal right ureteral stone who elected for trial of passage.   Today he reports his pain has not recurred since I last saw him, however he never saw stone pass.  KUB today with no radiopaque ureteral stones.  He has stable appearing phleboliths.  In-office UA today positive for 1+ protein and trace intact blood; urine microscopy with 3-10 RBCs/HPF.  PMH: Past Medical History:  Diagnosis Date   Actinic keratosis    Basal cell carcinoma 12/20/2015   R nasal tip, R nasolabial    Basal cell carcinoma 01/12/2020   L upper nasal dorsum (clear w/bx), recurrent bx/edc 05/02/2021   Basal cell carcinoma 01/12/2020   Lower sternum, EDC 04/05/20    Basal cell carcinoma 02/26/2019   L post auricular neck    Basal cell carcinoma 02/19/2023   left upper nasal dorsum, Mohs 04/16/23   BCC (basal cell carcinoma of skin) 08/02/2021   R glabella, EDC   Hypertension     Surgical History: Past Surgical History:  Procedure Laterality Date   CYSTOSCOPY/URETEROSCOPY/HOLMIUM LASER/STENT PLACEMENT Left 04/25/2022   Procedure: CYSTOSCOPY/HOLMIUM LASER/ RETORGRADE PYLEOGRAM/ LITHOLAPLAXY;  Surgeon: Twylla Glendia BROCKS, MD;  Location: [facility] ORS;  Service: Urology;  Laterality: Left;   POLYPECTOMY  1992    Home Medications:  Allergies as of 06/09/2024   No Known Allergies      Medication List        Accurate as of June 09, 2024  9:56 AM. If you have any questions, ask your nurse or doctor.          amLODipine 5 MG tablet Commonly known as: NORVASC Take 5 mg by mouth daily.   CENTRUM SILVER PO Take by mouth.   lisinopril  40 MG tablet Commonly known as: ZESTRIL  Take 40 mg by mouth daily.   ondansetron  4 MG disintegrating tablet Commonly known  as: ZOFRAN -ODT Take 1 tablet (4 mg total) by mouth every 8 (eight) hours as needed for nausea or vomiting.   oxyCODONE  5 MG immediate release tablet Commonly known as: Roxicodone  Take 1 tablet (5 mg total) by mouth every 8 (eight) hours as needed.   tamsulosin  0.4 MG Caps capsule Commonly known as: FLOMAX  Take 1 capsule (0.4 mg total) by mouth daily.        Allergies:  No Known Allergies  Family History: No family history on file.  Social History:   reports that he has never smoked. He has never been exposed to tobacco smoke. He has never used smokeless tobacco. No history on file for alcohol use and drug use.  Physical Exam: BP (!) 160/73   Pulse 76   Ht 6' (1.829 m)   Wt 219 lb (99.3 kg)   BMI 29.70 kg/m   Constitutional:  Alert and oriented, no acute distress, nontoxic appearing HEENT: [place], AT Cardiovascular: No clubbing, cyanosis, or edema Respiratory: Normal respiratory effort, no increased work of breathing Skin: No rashes, bruises or suspicious lesions Neurologic: Grossly intact, no focal deficits, moving all 4 extremities Psychiatric: Normal mood and affect  Laboratory Data: Results for orders placed or performed in visit on 06/09/24  Microscopic Examination   Collection Time: 06/09/24  9:44 AM   Urine  Result Value Ref Range   WBC,  UA 0-5 0 - 5 /hpf   RBC, Urine 3-10 (A) 0 - 2 /hpf   Epithelial Cells (non renal) 0-10 0 - 10 /hpf   Mucus, UA Present (A) Not Estab.   Bacteria, UA Few None seen/Few  Urinalysis, Complete   Collection Time: 06/09/24  9:44 AM  Result Value Ref Range   Specific Gravity, UA 1.020 1.005 - 1.030   pH, UA 6.0 5.0 - 7.5   Color, UA Yellow Yellow   Appearance Ur Clear Clear   Leukocytes,UA Negative Negative   Protein,UA 1+ (A) Negative/Trace   Glucose, UA Negative Negative   Ketones, UA Negative Negative   RBC, UA Trace (A) Negative   Bilirubin, UA Negative Negative   Urobilinogen, Ur 1.0 0.2 - 1.0 mg/dL   Nitrite, UA  Negative Negative   Microscopic Examination See below:    Pertinent Imaging: KUB, 06/09/2024: See epic  I personally reviewed the images referenced above and note no radiopaque ureteral stones.  Assessment & Plan:   1. Right ureteral stone (Primary) Pain resolved and no stone seen on KUB today.  He has some persistent microscopic hematuria, improved compared to prior.  I think it is likely that he spontaneously passed the stone and his micro heme will be time-limited.  I offered them follow-up renal ultrasound in a month, but they prefer to defer this, which is reasonable.  They can follow-up with me as needed.  We discussed return precautions including return of pain, nausea, vomiting, or fevers. - Urinalysis, Complete   Return if symptoms worsen or fail to improve.  Lucie Hones, PA-C  [facility] [place] [address] [place], [place] [address] [phone]

## 2024-06-20 ENCOUNTER — Ambulatory Visit: Payer: Self-pay | Admitting: Physician Assistant

## 2024-06-23 ENCOUNTER — Other Ambulatory Visit: Payer: Self-pay

## 2024-06-23 DIAGNOSIS — N201 Calculus of ureter: Secondary | ICD-10-CM

## 2024-06-23 MED ORDER — TAMSULOSIN HCL 0.4 MG PO CAPS: 0.4000 mg | ORAL_CAPSULE | Freq: Every day | ORAL | 0 refills | Status: DC

## 2024-06-30 ENCOUNTER — Other Ambulatory Visit: Payer: Self-pay | Admitting: Physician Assistant

## 2024-06-30 DIAGNOSIS — N201 Calculus of ureter: Secondary | ICD-10-CM

## 2024-07-01 ENCOUNTER — Other Ambulatory Visit: Payer: Self-pay | Admitting: Physician Assistant

## 2024-07-01 DIAGNOSIS — N201 Calculus of ureter: Secondary | ICD-10-CM

## 2024-07-02 ENCOUNTER — Other Ambulatory Visit: Payer: Self-pay

## 2024-07-02 DIAGNOSIS — N201 Calculus of ureter: Secondary | ICD-10-CM

## 2024-07-02 MED ORDER — TAMSULOSIN HCL 0.4 MG PO CAPS: 0.4000 mg | ORAL_CAPSULE | Freq: Every day | ORAL | 0 refills | Status: AC

## 2024-07-07 ENCOUNTER — Ambulatory Visit: Admitting: Physician Assistant

## 2024-07-07 ENCOUNTER — Encounter: Payer: Self-pay | Admitting: Physician Assistant

## 2024-07-07 VITALS — BP 126/72 | HR 86 | Ht 72.0 in | Wt 218.0 lb

## 2024-07-07 DIAGNOSIS — N201 Calculus of ureter: Secondary | ICD-10-CM | POA: Diagnosis not present

## 2024-07-07 DIAGNOSIS — N132 Hydronephrosis with renal and ureteral calculous obstruction: Secondary | ICD-10-CM | POA: Diagnosis not present

## 2024-07-07 LAB — URINALYSIS, COMPLETE
Bilirubin, UA: NEGATIVE
Glucose, UA: NEGATIVE
Ketones, UA: NEGATIVE
Leukocytes,UA: NEGATIVE
Nitrite, UA: NEGATIVE
RBC, UA: NEGATIVE
Specific Gravity, UA: 1.03 (ref 1.005–1.030)
Urobilinogen, Ur: 0.2 mg/dL (ref 0.2–1.0)
pH, UA: 6 (ref 5.0–7.5)

## 2024-07-07 LAB — MICROSCOPIC EXAMINATION

## 2024-07-07 NOTE — Patient Instructions (Signed)
 CT scan scheduling: [id number]

## 2024-07-07 NOTE — Progress Notes (Unsigned)
 07/07/2024 9:12 AM   Caleb Maldonado April 06, 1936 [id number]  CC: Chief Complaint  Patient presents with   Follow-up   HPI: Caleb Maldonado is a 88 y.o. male with PMH nephrolithiasis and BPH who presents today for follow-up of a 4 mm proximal right ureteral stone on trial of passage.   I saw him in clinic most recently on 06/09/2024.  He was asymptomatic at that time, and KUB showed the stone had intervally migrated to the distal right ureter.  Today he reports he continues to have no flank pain, however he still has not seen a stone pass.  In-office UA today positive for trace protein; urine microscopy with moderate bacteria.   PMH: Past Medical History:  Diagnosis Date   Actinic keratosis    Basal cell carcinoma 12/20/2015   R nasal tip, R nasolabial    Basal cell carcinoma 01/12/2020   L upper nasal dorsum (clear w/bx), recurrent bx/edc 05/02/2021   Basal cell carcinoma 01/12/2020   Lower sternum, EDC 04/05/20    Basal cell carcinoma 02/26/2019   L post auricular neck    Basal cell carcinoma 02/19/2023   left upper nasal dorsum, Mohs 04/16/23   BCC (basal cell carcinoma of skin) 08/02/2021   R glabella, EDC   Hypertension     Surgical History: Past Surgical History:  Procedure Laterality Date   CYSTOSCOPY/URETEROSCOPY/HOLMIUM LASER/STENT PLACEMENT Left 04/25/2022   Procedure: CYSTOSCOPY/HOLMIUM LASER/ RETORGRADE PYLEOGRAM/ LITHOLAPLAXY;  Surgeon: Twylla Glendia BROCKS, MD;  Location: [facility] ORS;  Service: Urology;  Laterality: Left;   POLYPECTOMY  1992    Home Medications:  Allergies as of 07/07/2024   No Known Allergies      Medication List        Accurate as of July 07, 2024  9:12 AM. If you have any questions, ask your nurse or doctor.          amLODipine 5 MG tablet Commonly known as: NORVASC Take 5 mg by mouth daily.   CENTRUM SILVER PO Take by mouth.   lisinopril  40 MG tablet Commonly known as: ZESTRIL  Take 40 mg by mouth daily.   ondansetron  4 MG  disintegrating tablet Commonly known as: ZOFRAN -ODT Take 1 tablet (4 mg total) by mouth every 8 (eight) hours as needed for nausea or vomiting.   oxyCODONE  5 MG immediate release tablet Commonly known as: Roxicodone  Take 1 tablet (5 mg total) by mouth every 8 (eight) hours as needed.   tamsulosin  0.4 MG Caps capsule Commonly known as: FLOMAX  Take 1 capsule (0.4 mg total) by mouth daily.        Allergies:  No Known Allergies  Family History: No family history on file.  Social History:   reports that he has never smoked. He has never been exposed to tobacco smoke. He has never used smokeless tobacco. No history on file for alcohol use and drug use.  Physical Exam: BP 126/72   Pulse 86   Ht 6' (1.829 m)   Wt 218 lb (98.9 kg)   BMI 29.57 kg/m   Constitutional:  Alert and oriented, no acute distress, nontoxic appearing HEENT: [place], AT Cardiovascular: No clubbing, cyanosis, or edema Respiratory: Normal respiratory effort, no increased work of breathing Skin: No rashes, bruises or suspicious lesions Neurologic: Grossly intact, no focal deficits, moving all 4 extremities Psychiatric: Normal mood and affect  Laboratory Data: Results for orders placed or performed in visit on 07/07/24  Microscopic Examination   Collection Time: 07/07/24  8:51 AM  Urine  Result Value Ref Range   WBC, UA 0-5 0 - 5 /hpf   RBC, Urine 0-2 0 - 2 /hpf   Epithelial Cells (non renal) 0-10 0 - 10 /hpf   Mucus, UA Present (A) Not Estab.   Bacteria, UA Moderate (A) None seen/Few  Urinalysis, Complete   Collection Time: 07/07/24  8:51 AM  Result Value Ref Range   Specific Gravity, UA 1.030 1.005 - 1.030   pH, UA 6.0 5.0 - 7.5   Color, UA Yellow Yellow   Appearance Ur Clear Clear   Leukocytes,UA Negative Negative   Protein,UA Trace Negative/Trace   Glucose, UA Negative Negative   Ketones, UA Negative Negative   RBC, UA Negative Negative   Bilirubin, UA Negative Negative   Urobilinogen, Ur 0.2  0.2 - 1.0 mg/dL   Nitrite, UA Negative Negative   Microscopic Examination Comment    Microscopic Examination See below:    Assessment & Plan:   1. Right ureteral stone (Primary) UA bland, but he has still not seen a stone pass.  Stone has been poorly visualized on imaging.  I recommended CT stone study for definitive evaluation and he agreed.  Will contact him with results when available. - Urinalysis, Complete - CT RENAL STONE STUDY; Future   Return for Will call with results.  Lucie Hones, PA-C  [facility] [place] [address], [address] [place], [place] [address] [phone]

## 2024-07-09 ENCOUNTER — Ambulatory Visit
Admission: RE | Admit: 2024-07-09 | Discharge: 2024-07-09 | Disposition: A | Discharge status: Home / Self Care | Source: Ambulatory Visit | Attending: Physician Assistant | Admitting: Physician Assistant

## 2024-07-09 DIAGNOSIS — K5730 Diverticulosis of large intestine without perforation or abscess without bleeding: Secondary | ICD-10-CM | POA: Diagnosis not present

## 2024-07-09 DIAGNOSIS — N202 Calculus of kidney with calculus of ureter: Secondary | ICD-10-CM | POA: Diagnosis not present

## 2024-07-09 DIAGNOSIS — N201 Calculus of ureter: Secondary | ICD-10-CM | POA: Diagnosis not present

## 2024-07-09 DIAGNOSIS — N400 Enlarged prostate without lower urinary tract symptoms: Secondary | ICD-10-CM | POA: Diagnosis not present

## 2024-07-09 DIAGNOSIS — K8020 Calculus of gallbladder without cholecystitis without obstruction: Secondary | ICD-10-CM | POA: Diagnosis not present

## 2024-07-13 ENCOUNTER — Ambulatory Visit: Payer: Self-pay | Admitting: Urology

## 2024-07-24 ENCOUNTER — Ambulatory Visit: Admitting: Physician Assistant

## 2024-07-24 VITALS — BP 153/75 | HR 65 | Ht 72.0 in | Wt 218.0 lb

## 2024-07-24 DIAGNOSIS — N201 Calculus of ureter: Secondary | ICD-10-CM | POA: Diagnosis not present

## 2024-07-24 LAB — URINALYSIS, COMPLETE
Bilirubin, UA: NEGATIVE
Glucose, UA: NEGATIVE
Ketones, UA: NEGATIVE
Leukocytes,UA: NEGATIVE
Nitrite, UA: NEGATIVE
Specific Gravity, UA: 1.015 (ref 1.005–1.030)
Urobilinogen, Ur: 0.2 mg/dL (ref 0.2–1.0)
pH, UA: 7.5 (ref 5.0–7.5)

## 2024-07-24 LAB — MICROSCOPIC EXAMINATION: Bacteria, UA: NONE SEEN

## 2024-07-24 NOTE — Patient Instructions (Signed)
 Today we discussed two options for your kidney stone on the right side: Shockwave lithotripsy, the less invasive option where we try to blast the stone from the outside and you pass the pieces. Ureteroscopy surgery, an outpatient (no overnight hospital stay) surgery where we treat the stone directly with a laser and place a temporary ureteral stent afterward. More information about these options are at the back of this packet. Please call me when you decide what you'd like to do.

## 2024-07-24 NOTE — Progress Notes (Signed)
 07/24/2024 11:54 AM   Caleb Maldonado 1936-01-07 [id number]  CC: Chief Complaint  Patient presents with   Follow-up   Nephrolithiasis   HPI: Caleb Maldonado is a 88 y.o. male with PMH nephrolithiasis and BPH who presents today to discuss definitive management of a 4 mm distal right ureteral stone. He is accompanied today by his wife, Caleb Maldonado, who contributes to HPI.  His right ureteral stone was originally diagnosed in the emergency department on 04/30/2024; he elected for trial of passage.  He has been minimally symptomatic.  Follow-up CT stone study on 07/09/2024 showed a retained 4 mm distal right ureteral stone without hydronephrosis.  Today he reports he remains comfortable with no acute concerns.  Stone measures up to 1000 HU in density, approximate skin to stone distance 13 cm. It is radiopaque.  In-office UA today positive for trace protein and trace-lysed blood; urine microscopy with 3-10 RBCs/HPF.  PMH: Past Medical History:  Diagnosis Date   Actinic keratosis    Basal cell carcinoma 12/20/2015   R nasal tip, R nasolabial    Basal cell carcinoma 01/12/2020   L upper nasal dorsum (clear w/bx), recurrent bx/edc 05/02/2021   Basal cell carcinoma 01/12/2020   Lower sternum, EDC 04/05/20    Basal cell carcinoma 02/26/2019   L post auricular neck    Basal cell carcinoma 02/19/2023   left upper nasal dorsum, Mohs 04/16/23   BCC (basal cell carcinoma of skin) 08/02/2021   R glabella, EDC   Hypertension     Surgical History: Past Surgical History:  Procedure Laterality Date   CYSTOSCOPY/URETEROSCOPY/HOLMIUM LASER/STENT PLACEMENT Left 04/25/2022   Procedure: CYSTOSCOPY/HOLMIUM LASER/ RETORGRADE PYLEOGRAM/ LITHOLAPLAXY;  Surgeon: Twylla Glendia BROCKS, MD;  Location: [facility] ORS;  Service: Urology;  Laterality: Left;   POLYPECTOMY  1992    Home Medications:  Allergies as of 07/24/2024   No Known Allergies      Medication List        Accurate as of July 24, 2024 11:54 AM.  If you have any questions, ask your nurse or doctor.          amLODipine 5 MG tablet Commonly known as: NORVASC Take 5 mg by mouth daily.   CENTRUM SILVER PO Take by mouth.   lisinopril  40 MG tablet Commonly known as: ZESTRIL  Take 40 mg by mouth daily.   ondansetron  4 MG disintegrating tablet Commonly known as: ZOFRAN -ODT Take 1 tablet (4 mg total) by mouth every 8 (eight) hours as needed for nausea or vomiting.   oxyCODONE  5 MG immediate release tablet Commonly known as: Roxicodone  Take 1 tablet (5 mg total) by mouth every 8 (eight) hours as needed.   tamsulosin  0.4 MG Caps capsule Commonly known as: FLOMAX  Take 1 capsule (0.4 mg total) by mouth daily.        Allergies:  No Known Allergies  Family History: No family history on file.  Social History:   reports that he has never smoked. He has never been exposed to tobacco smoke. He has never used smokeless tobacco. No history on file for alcohol use and drug use.  Physical Exam: BP (!) 153/75 (BP Location: Left Arm, Patient Position: Sitting, Cuff Size: Normal)   Pulse 65   Ht 6' (1.829 m)   Wt 218 lb (98.9 kg)   SpO2 93%   BMI 29.57 kg/m   Constitutional:  Alert and oriented, no acute distress, nontoxic appearing HEENT: [place], AT Cardiovascular: No clubbing, cyanosis, or edema Respiratory: Normal respiratory effort,  no increased work of breathing Skin: No rashes, bruises or suspicious lesions Neurologic: Grossly intact, no focal deficits, moving all 4 extremities Psychiatric: Normal mood and affect  Laboratory Data: Results for orders placed or performed in visit on 07/24/24  Microscopic Examination   Collection Time: 07/24/24 11:25 AM   Urine  Result Value Ref Range   WBC, UA 0-5 0 - 5 /hpf   RBC, Urine 3-10 (A) 0 - 2 /hpf   Epithelial Cells (non renal) 0-10 0 - 10 /hpf   Crystals Present (A) N/A   Crystal Type Amorphous Sediment N/A   Bacteria, UA None seen None seen/Few  Urinalysis, Complete    Collection Time: 07/24/24 11:25 AM  Result Value Ref Range   Specific Gravity, UA 1.015 1.005 - 1.030   pH, UA 7.5 5.0 - 7.5   Color, UA Yellow Yellow   Appearance Ur Clear Clear   Leukocytes,UA Negative Negative   Protein,UA Trace Negative/Trace   Glucose, UA Negative Negative   Ketones, UA Negative Negative   RBC, UA Trace (A) Negative   Bilirubin, UA Negative Negative   Urobilinogen, Ur 0.2 0.2 - 1.0 mg/dL   Nitrite, UA Negative Negative   Microscopic Examination See below:    Pertinent Imaging: Results for orders placed during the hospital encounter of 07/09/24  CT RENAL STONE STUDY  Narrative CLINICAL DATA:  Stone burden evaluate, posttreatment.  EXAM: CT ABDOMEN AND PELVIS WITHOUT CONTRAST  TECHNIQUE: Multidetector CT imaging of the abdomen and pelvis was performed following the standard protocol without IV contrast.  RADIATION DOSE REDUCTION: This exam was performed according to the departmental dose-optimization program which includes automated exposure control, adjustment of the mA and/or kV according to patient size and/or use of iterative reconstruction technique.  COMPARISON:  CT April 30, 2024 and MRI April 24, 2022  FINDINGS: Lower chest: Hypoventilatory change in the dependent lungs.  Hepatobiliary: 12 mm hypodensity in the dome of the liver on image 16/2 not seen on MRI April 24, 2022 and measuring 6 mm on CT April 30, 2024. Stable hypodense lesion in the caudate lobe of the liver measuring 7 mm on image 20/2 previously reflecting a cyst on MRI April 24, 2022. Cholelithiasis without findings of acute cholecystitis. No biliary ductal dilation.  Pancreas: Fatty pancreatic atrophy. No pancreatic ductal dilation or evidence of acute inflammation.  Spleen: No splenomegaly.  Adrenals/Urinary Tract: No suspicious adrenal nodule/mass.  No hydronephrosis. Stable hypodense right upper and lower pole renal lesions compatible with cysts on prior MRI.  No  significant interval change left renal stones within a prominent upper pole calyx measuring up to 6 mm.  Distal migration of the right ureteral stone now in the lower ureter just upstream from the UVJ on image 83/2 measuring 4 mm.  Urinary bladder is minimally distended.  Stomach/Bowel: Stomach is unremarkable for degree of distension. No pathologic dilation of small or large bowel. Scattered colonic diverticulosis.  Vascular/Lymphatic: Aortic atherosclerosis. No enlarged abdominal or pelvic lymph nodes.  Reproductive: Enlarged prostate gland.  Other: No significant abdominopelvic free fluid.  Musculoskeletal: Thoracolumbar spondylosis.  IMPRESSION: 1. Distal migration of the right ureteral stone now in the lower ureter just upstream from the UVJ measuring 4 mm. No hydronephrosis. 2. No significant interval change in the left renal stones within a prominent upper pole calyx measuring up to 6 mm. 3. 12 mm hypodensity in the dome of the liver not seen on MRI April 24, 2022 and measuring 6 mm on CT April 30, 2024.  This is nonspecific and may reflect a cyst or hemangioma but is incompletely characterized on this noncontrast examination. Consider further evaluation with nonemergent hepatic protocol MRI with and without contrast. 4. Cholelithiasis without findings of acute cholecystitis. 5. Colonic diverticulosis without evidence of acute diverticulitis. 6. Enlarged prostate gland. 7. Aortic atherosclerosis.  Aortic Atherosclerosis (ICD10-I70.0).   Electronically Signed By: Reyes Holder M.D. On: 07/12/2024 14:58  I personally reviewed the images referenced above and note a nonobstructing 4 mm distal right ureteral stone.  Assessment & Plan:   1. Right ureteral stone (Primary) Failure to spontaneously pass a 4 mm distal right ureteral stone.  No signs of infection and he remains asymptomatic.  We discussed that given the duration of his ureteral stone episode, I think it is  unlikely that he will spontaneously pass this.  I recommended consideration of definitive management.  We discussed various treatment options for his stone including ESWL vs. ureteroscopy with laser lithotripsy and stent.   We specifically discussed that ESWL is a less invasive procedure that requires less anesthesia, however patients have to pass their residual stone fragments, which may take several weeks and be associated with continued renal colic. Additionally, we discussed the limitations of ESWL including the low, 10-20% chance of treatment failure requiring repeat ESWL versus ureteroscopy in the future. By comparison, ureteroscopy is a more invasive surgical procedure that requires more anesthesia and placement of a ureteral stent, which will remain in place for approximately 3-10 days and can be associated with flank pain, bladder pain, dysuria, urgency, frequency, urinary leakage, and gross hematuria.  Based on this conversation, he would like to discuss these options further with his family and let me know how he wishes to proceed.  We specifically discussed the risks of infection and renal injury with a retained or obstructing ureteral stone. - Urinalysis, Complete - CULTURE, URINE COMPREHENSIVE   Return for Patient to call with preferred stone treatment option.  Lucie Hones, PA-C  [facility] [place] [address], [address] [place], [place] [address] [phone]

## 2024-07-29 LAB — CULTURE, URINE COMPREHENSIVE

## 2024-08-07 DIAGNOSIS — I10 Essential (primary) hypertension: Secondary | ICD-10-CM | POA: Diagnosis not present

## 2024-08-12 ENCOUNTER — Telehealth (INDEPENDENT_AMBULATORY_CARE_PROVIDER_SITE_OTHER): Payer: Self-pay

## 2024-08-12 DIAGNOSIS — N201 Calculus of ureter: Secondary | ICD-10-CM

## 2024-08-12 NOTE — Telephone Encounter (Signed)
 Patient wife called in and states that patient does not wish to proceed with any stone surgery at this time. Informed patient that I would update Provider.

## 2024-08-14 DIAGNOSIS — I10 Essential (primary) hypertension: Secondary | ICD-10-CM | POA: Diagnosis not present

## 2024-08-14 DIAGNOSIS — E876 Hypokalemia: Secondary | ICD-10-CM | POA: Diagnosis not present

## 2024-08-14 DIAGNOSIS — Z0000 Encounter for general adult medical examination without abnormal findings: Secondary | ICD-10-CM | POA: Diagnosis not present

## 2024-08-14 DIAGNOSIS — Z1331 Encounter for screening for depression: Secondary | ICD-10-CM | POA: Diagnosis not present

## 2024-08-15 NOTE — Addendum Note (Signed)
 Addended by: Nyeli Holtmeyer P on: 08/15/2024 12:56 PM   Modules accepted: Orders, Level of Service

## 2024-08-15 NOTE — Telephone Encounter (Signed)
 The risk of a retained ureteral stone include infection, sepsis, recurrent pain, blood in the urine, and kidney damage if the stone causes prolonged blockage. Please make sure he understands those risks.

## 2024-08-15 NOTE — Telephone Encounter (Signed)
 Per Charisse: Spoke with patients wife and went over possible risks of holding off the surgery. Wife stated that he does get up and use the bathroom more at night but pain is bearable. Described both surgeries ESWL vs ureteroscopy with laser lithotripsy and stent as you stated in your previous office note and she has decided to go with the less invasive surgery ESWL. I informed her I would let you know and a staff member from our clinic will be in touch with next steps. Wife voiced understanding.  ESWL orders in.

## 2024-08-18 ENCOUNTER — Telehealth: Payer: Self-pay

## 2024-08-18 ENCOUNTER — Other Ambulatory Visit: Payer: Self-pay

## 2024-08-18 DIAGNOSIS — N201 Calculus of ureter: Secondary | ICD-10-CM

## 2024-08-18 NOTE — Telephone Encounter (Signed)
 Prior auth sent via cover my meds. Awaiting approval.

## 2024-08-18 NOTE — Progress Notes (Unsigned)
 ESWL ORDER FORM  Expected date of procedure: Next available  Surgeon: TBD  Post op standing: 2-4wk follow up w/KUB prior  Anticoagulation/Aspirin/NSAID standing order: Hold all 72 hours prior  Anesthesia standing order: MAC  VTE standing: SCD's  Dx: Right Ureteral Stone  Procedure: right Extracorporeal shock wave lithotripsy  CPT : 50590  Standing Order Set:   *NPO after mn, KUB  *NS 174ml/hr, Keflex 500mg  PO, Benadryl 25mg  PO, Valium 10mg  PO, Zofran  4mg  IV  Medications if other than standing orders:   N/A

## 2024-08-19 NOTE — Progress Notes (Signed)
    [place] ESWL POSTING SHEET        Patient Name: Caleb Maldonado  DOB: Jun 20, 1936  MRN: [id number]  Surgeon:  Redell Burnet, MD  Diagnosis:  Right Ureteral Stone  CPT: 49409  ESWL DATE: 09/04/2024  ESWL TIME: 0845  Special Needs/Requirements: None       Cardiac/Medical/Pulmonary Clearance needed: no       Form Faxed to [facility]- [phone] Date:   Date: 08/19/24       Form Faxed to [place]- [phone]  Date:  Date: 08/19/24           Copy Made for Insurance PA:  Date: 08/19/24       Orders Entered in to Epic:  Date: 08/19/24

## 2024-09-02 MED ORDER — ACETAMINOPHEN 10 MG/ML IV SOLN
INTRAVENOUS | Status: AC
  Filled 2024-09-02: qty 100

## 2024-09-03 MED ORDER — CEPHALEXIN 500 MG PO CAPS
500.0000 mg | ORAL_CAPSULE | Freq: Once | ORAL | Status: AC
  Administered 2024-09-04: 500 mg via ORAL

## 2024-09-03 MED ORDER — DIPHENHYDRAMINE HCL 25 MG PO CAPS
25.0000 mg | ORAL_CAPSULE | ORAL | Status: AC
  Administered 2024-09-04: 25 mg via ORAL

## 2024-09-03 MED ORDER — SODIUM CHLORIDE 0.9 % IV SOLN: INTRAVENOUS | Status: AC

## 2024-09-03 MED ORDER — DIAZEPAM 5 MG PO TABS
10.0000 mg | ORAL_TABLET | ORAL | Status: AC
  Administered 2024-09-04: 10 mg via ORAL

## 2024-09-03 MED ORDER — ONDANSETRON HCL 4 MG/2ML IJ SOLN
4.0000 mg | Freq: Once | INTRAMUSCULAR | Status: AC
  Administered 2024-09-04: 4 mg via INTRAVENOUS

## 2024-09-04 ENCOUNTER — Ambulatory Visit

## 2024-09-04 ENCOUNTER — Other Ambulatory Visit: Payer: Self-pay | Admitting: Urology

## 2024-09-04 ENCOUNTER — Encounter
Admission: RE | Disposition: A | Discharge status: Home / Self Care | Payer: Self-pay | Source: Home / Self Care | Attending: Urology

## 2024-09-04 ENCOUNTER — Other Ambulatory Visit: Payer: Self-pay

## 2024-09-04 ENCOUNTER — Ambulatory Visit: Admission: RE | Admit: 2024-09-04 | Discharge: 2024-09-04 | Disposition: A | Attending: Urology | Admitting: Urology

## 2024-09-04 ENCOUNTER — Encounter: Payer: Self-pay | Admitting: Urology

## 2024-09-04 DIAGNOSIS — N200 Calculus of kidney: Secondary | ICD-10-CM | POA: Diagnosis not present

## 2024-09-04 DIAGNOSIS — I878 Other specified disorders of veins: Secondary | ICD-10-CM | POA: Diagnosis not present

## 2024-09-04 DIAGNOSIS — I440 Atrioventricular block, first degree: Secondary | ICD-10-CM

## 2024-09-04 DIAGNOSIS — N201 Calculus of ureter: Secondary | ICD-10-CM | POA: Diagnosis not present

## 2024-09-04 HISTORY — PX: EXTRACORPOREAL SHOCK WAVE LITHOTRIPSY: SHX1557

## 2024-09-04 SURGERY — LITHOTRIPSY, ESWL
Anesthesia: Moderate Sedation | Laterality: Right

## 2024-09-04 MED ORDER — DIPHENHYDRAMINE HCL 25 MG PO CAPS
ORAL_CAPSULE | ORAL | Status: AC
  Filled 2024-09-04: qty 1

## 2024-09-04 MED ORDER — DIAZEPAM 5 MG PO TABS
ORAL_TABLET | ORAL | Status: AC
  Filled 2024-09-04: qty 2

## 2024-09-04 MED ORDER — CEPHALEXIN 500 MG PO CAPS
ORAL_CAPSULE | ORAL | Status: AC
  Filled 2024-09-04: qty 1

## 2024-09-04 MED ORDER — ONDANSETRON HCL 4 MG/2ML IJ SOLN
INTRAMUSCULAR | Status: AC
  Filled 2024-09-04: qty 2

## 2024-09-04 NOTE — Progress Notes (Addendum)
 Patient here today for a right shockwave lithotripsy for a chronic 4 mm right distal ureteral stone July 2025.  Irregular heart rate prior to beginning treatment and EKG ordered, this shows new first-degree AV block with occasional PVCs, left axis deviation, and a right bundle branch block.  Not cleared for procedure, and will need cardiology clearance prior to any intervention  Will place cardiology referral and will need to reschedule shockwave lithotripsy once evaluated and cleared  Redell Burnet, MD 09/04/2024

## 2024-09-04 NOTE — H&P (Signed)
   09/04/24 9:01 AM   Caleb Maldonado 1936/01/26 [id number]  CC: right ureteral stone  HPI: 88 yo M with 4mm right distal ureteral stone, failed trial of medical expulsive therapy and opted for SWL.Culture 10/23 negative   PMH: Past Medical History:  Diagnosis Date   Actinic keratosis    Basal cell carcinoma 12/20/2015   R nasal tip, R nasolabial    Basal cell carcinoma 01/12/2020   L upper nasal dorsum (clear w/bx), recurrent bx/edc 05/02/2021   Basal cell carcinoma 01/12/2020   Lower sternum, EDC 04/05/20    Basal cell carcinoma 02/26/2019   L post auricular neck    Basal cell carcinoma 02/19/2023   left upper nasal dorsum, Mohs 04/16/23   BCC (basal cell carcinoma of skin) 08/02/2021   R glabella, EDC   Hypertension     Surgical History: Past Surgical History:  Procedure Laterality Date   CYSTOSCOPY/URETEROSCOPY/HOLMIUM LASER/STENT PLACEMENT Left 04/25/2022   Procedure: CYSTOSCOPY/HOLMIUM LASER/ RETORGRADE PYLEOGRAM/ LITHOLAPLAXY;  Surgeon: Twylla Glendia BROCKS, MD;  Location: [facility] ORS;  Service: Urology;  Laterality: Left;   POLYPECTOMY  1992    Family History: History reviewed. No pertinent family history.  Social History:  reports that he has never smoked. He has never been exposed to tobacco smoke. He has never used smokeless tobacco. No history on file for alcohol use and drug use.  Physical Exam: BP (!) 161/69   Pulse (!) 43   Temp 97.9 F (36.6 C) (Tympanic)   Resp 16   SpO2 97%    Constitutional:  Alert and oriented, No acute distress. Cardiovascular:RRR Respiratory:CTA bilaterally GI: Abdomen is soft, nontender, nondistended, no abdominal masses  Laboratory Data: Culture 10/23 negative  Assessment & Plan:   88 year old male with 4 mm right distal ureteral stone, failed prolonged trial of medical expulsive therapy and opted for shockwave lithotripsy.  We discussed the risks of bleeding, infection, Steinstrasse, obstructive fragments potentially requiring  intervention in the future, hematoma, and potential need for additional treatments.  Right shockwave lithotripsy today   Redell Burnet, MD 09/04/2024  09/04/2024  [facility] [address] [place], [place] [address] [phone]

## 2024-09-05 ENCOUNTER — Encounter: Payer: Self-pay | Admitting: Urology

## 2024-09-09 NOTE — Progress Notes (Unsigned)
  Cardiology Office Note   Date:  09/09/2024  ID:  [name], DOB [birth year], MRN [id number] PCP: Alla Amis, MD  [facility] Providers Cardiologist:  None { Click to update primary MD,subspecialty MD or APP then REFRESH:1}    History of Present Illness Caleb Maldonado is a 88 y.o. male ***  ROS: ***  Studies Reviewed      *** Risk Assessment/Calculations {Does this patient have ATRIAL FIBRILLATION?[phone]} No BP recorded.  {Refresh Note OR Click here to enter BP  :1}***       Physical Exam VS:  There were no vitals taken for this visit.       Wt Readings from Last 3 Encounters:  07/24/24 218 lb (98.9 kg)  07/07/24 218 lb (98.9 kg)  06/09/24 219 lb (99.3 kg)    GEN: Well nourished, well developed in no acute distress NECK: No JVD; No carotid bruits CARDIAC: ***RRR, no murmurs, rubs, gallops RESPIRATORY:  Clear to auscultation without rales, wheezing or rhonchi  ABDOMEN: Soft, non-tender, non-distended EXTREMITIES:  No edema; No deformity   ASSESSMENT AND PLAN ***    {Are you ordering a CV Procedure (e.g. stress test, cath, DCCV, TEE, etc)?   Press F2        :[id number]}  Dispo: ***  Signed, Presten Joost, NP

## 2024-09-10 ENCOUNTER — Encounter: Payer: Self-pay | Admitting: Cardiology

## 2024-09-10 ENCOUNTER — Ambulatory Visit: Attending: Cardiology | Admitting: Cardiology

## 2024-09-10 ENCOUNTER — Ambulatory Visit

## 2024-09-10 VITALS — BP 160/70 | HR 83 | Ht 72.0 in | Wt 218.0 lb

## 2024-09-10 DIAGNOSIS — I493 Ventricular premature depolarization: Secondary | ICD-10-CM | POA: Diagnosis not present

## 2024-09-10 DIAGNOSIS — I440 Atrioventricular block, first degree: Secondary | ICD-10-CM

## 2024-09-10 DIAGNOSIS — I4510 Unspecified right bundle-branch block: Secondary | ICD-10-CM | POA: Diagnosis not present

## 2024-09-10 DIAGNOSIS — I452 Bifascicular block: Secondary | ICD-10-CM | POA: Diagnosis not present

## 2024-09-10 DIAGNOSIS — I10 Essential (primary) hypertension: Secondary | ICD-10-CM

## 2024-09-10 DIAGNOSIS — R5383 Other fatigue: Secondary | ICD-10-CM | POA: Diagnosis not present

## 2024-09-10 DIAGNOSIS — R9431 Abnormal electrocardiogram [ECG] [EKG]: Secondary | ICD-10-CM | POA: Diagnosis not present

## 2024-09-10 DIAGNOSIS — N200 Calculus of kidney: Secondary | ICD-10-CM | POA: Diagnosis not present

## 2024-09-10 DIAGNOSIS — I444 Left anterior fascicular block: Secondary | ICD-10-CM | POA: Diagnosis not present

## 2024-09-10 NOTE — Patient Instructions (Signed)
 Medication Instructions:  Your physician recommends that you continue on your current medications as directed. Please refer to the Current Medication list given to you today.   *If you need a refill on your cardiac medications before your next appointment, please call your pharmacy*  Lab Work: BMP, MAG, TSH If you have labs (blood work) drawn today and your tests are completely normal, you will receive your results only by: MyChart Message (if you have MyChart) OR A paper copy in the mail If you have any lab test that is abnormal or we need to change your treatment, we will call you to review the results.  Testing/Procedures: Your physician has requested that you have an echocardiogram. Echocardiography is a painless test that uses sound waves to create images of your heart. It provides your doctor with information about the size and shape of your heart and how well your hearts chambers and valves are working.   You may receive an ultrasound enhancing agent through an IV if needed to better visualize your heart during the echo. This procedure takes approximately one hour.  There are no restrictions for this procedure.  This will take place at [address] [facility] [address], [place] [address]  Please note: We ask at that you not bring children with you during ultrasound (echo/ vascular) testing. Due to room size and safety concerns, children are not allowed in the ultrasound rooms during exams. Our front office staff cannot provide observation of children in our lobby area while testing is being conducted. An adult accompanying a patient to their appointment will only be allowed in the ultrasound room at the discretion of the ultrasound technician under special circumstances. We apologize for any inconvenience.    ZIO XT- Long Term Monitor Instructions  Your physician has requested you wear a ZIO patch monitor for 14 days.  This is a single patch monitor. [employer] supplies  one patch monitor per enrollment. Additional stickers are not available. Please do not apply patch if you will be having a Nuclear Stress Test,  Echocardiogram, Cardiac CT, MRI, or Chest Xray during the period you would be wearing the  monitor. The patch cannot be worn during these tests. You cannot remove and re-apply the  ZIO XT patch monitor.  Your ZIO patch monitor will be mailed 3 day USPS to your address on file. It may take 3-5 days  to receive your monitor after you have been enrolled.  Once you have received your monitor, please review the enclosed instructions. Your monitor  has already been registered assigning a specific monitor serial # to you.  Billing and Patient Assistance Program Information  We have supplied [employer] with any of your insurance information on file for billing purposes. [employer] offers a sliding scale Patient Assistance Program for patients that do not have  insurance, or whose insurance does not completely cover the cost of the ZIO monitor.  You must apply for the Patient Assistance Program to qualify for this discounted rate.  To apply, please call [employer] at [phone], select option 4, select option 2, ask to apply for  Patient Assistance Program. Meredeth will ask your household income, and how many people  are in your household. They will quote your out-of-pocket cost based on that information.  [employer] will also be able to set up a 40-month, interest-free payment plan if needed.  Applying the monitor   Shave hair from upper left chest.  Hold abrader disc by orange tab. Rub abrader in 40  strokes over the upper left chest as  indicated in your monitor instructions.  Clean area with 4 enclosed alcohol pads. Let dry.  Apply patch as indicated in monitor instructions. Patch will be placed under collarbone on left  side of chest with arrow pointing upward.  Rub patch adhesive wings for 2 minutes. Remove white label marked 1. Remove the white  label  marked 2. Rub patch adhesive wings for 2 additional minutes.  While looking in a mirror, press and release button in center of patch. A small green light will  flash 3-4 times. This will be your only indicator that the monitor has been turned on.  Do not shower for the first 24 hours. You may shower after the first 24 hours.  Press the button if you feel a symptom. You will hear a small click. Record Date, Time and  Symptom in the Patient Logbook.  When you are ready to remove the patch, follow instructions on the last 2 pages of Patient  Logbook. Stick patch monitor onto the last page of Patient Logbook.  Place Patient Logbook in the blue and white box. Use locking tab on box and tape box closed  securely. The blue and white box has prepaid postage on it. Please place it in the mailbox as  soon as possible. Your physician should have your test results approximately 7 days after the  monitor has been mailed back to [facility].  Call [facility] Customer Care at [phone] if you have questions regarding  your ZIO XT patch monitor. Call them immediately if you see an orange light blinking on your  monitor.  If your monitor falls off in less than 4 days, contact our Monitor department at [phone].  If your monitor becomes loose or falls off after 4 days call [employer] at [phone] for  suggestions on securing your monitor   Follow-Up: At [facility], you and your health needs are our priority.  As part of our continuing mission to provide you with exceptional heart care, our providers are all part of one team.  This team includes your primary Cardiologist (physician) and Advanced Practice Providers or APPs (Physician Assistants and Nurse Practitioners) who all work together to provide you with the care you need, when you need it.  Your next appointment:   8 week(s) post ECHO  Provider:   Tylene Lunch, NP   We recommend signing up for the patient portal called  MyChart.  Sign up information is provided on this After Visit Summary.  MyChart is used to connect with patients for Virtual Visits (Telemedicine).  Patients are able to view lab/test results, encounter notes, upcoming appointments, etc.  Non-urgent messages can be sent to your provider as well.   To learn more about what you can do with MyChart, go to [email or web address].

## 2024-09-11 ENCOUNTER — Ambulatory Visit: Payer: Self-pay | Admitting: Cardiology

## 2024-09-11 ENCOUNTER — Encounter: Payer: Self-pay | Admitting: Cardiology

## 2024-09-11 DIAGNOSIS — I493 Ventricular premature depolarization: Secondary | ICD-10-CM

## 2024-09-11 LAB — BASIC METABOLIC PANEL WITH GFR
BUN/Creatinine Ratio: 16 (ref 10–24)
BUN: 19 mg/dL (ref 8–27)
CO2: 22 mmol/L (ref 20–29)
Calcium: 9.9 mg/dL (ref 8.6–10.2)
Chloride: 104 mmol/L (ref 96–106)
Creatinine, Ser: 1.18 mg/dL (ref 0.76–1.27)
Glucose: 124 mg/dL — ABNORMAL HIGH (ref 70–99)
Potassium: 3.9 mmol/L (ref 3.5–5.2)
Sodium: 146 mmol/L — ABNORMAL HIGH (ref 134–144)
eGFR: 60 mL/min/1.73 (ref >59)

## 2024-09-11 LAB — MAGNESIUM: Magnesium: 1.9 mg/dL (ref 1.6–2.3)

## 2024-09-11 LAB — TSH: TSH: 2.84 u[IU]/mL (ref 0.450–4.500)

## 2024-09-11 NOTE — Progress Notes (Signed)
 Potassium has improved with him increasing potassium rich foods in his diet.  Kidney function has also improved.  Thyroid and magnesium are within normal limits.  Continue current medication regimen without changes needed at this time.

## 2024-09-29 ENCOUNTER — Ambulatory Visit: Admitting: Physician Assistant

## 2024-10-02 DIAGNOSIS — I493 Ventricular premature depolarization: Secondary | ICD-10-CM

## 2024-10-03 NOTE — Progress Notes (Signed)
 Average heart rate 75 bpm, rare PACs and frequent PVC with a burden of 28%.  527 episodes of nonsustained ventricular tachycardia.  With the frequency of the PVCs with an extended burden recommend referral to EP.  Also recommend ischemic evaluation with Lexiscan Myoview stress testing.

## 2024-10-08 ENCOUNTER — Other Ambulatory Visit

## 2024-10-13 ENCOUNTER — Ambulatory Visit: Payer: Self-pay | Attending: Cardiology

## 2024-10-13 DIAGNOSIS — I5030 Unspecified diastolic (congestive) heart failure: Secondary | ICD-10-CM | POA: Diagnosis not present

## 2024-10-13 DIAGNOSIS — I088 Other rheumatic multiple valve diseases: Secondary | ICD-10-CM

## 2024-10-13 DIAGNOSIS — I452 Bifascicular block: Secondary | ICD-10-CM | POA: Diagnosis not present

## 2024-10-13 LAB — ECHOCARDIOGRAM COMPLETE
AR max vel: 2.54 cm2
AV Area VTI: 2.74 cm2
AV Area mean vel: 2.53 cm2
AV Mean grad: 3 mmHg
AV Peak grad: 5.1 mmHg
Ao pk vel: 1.13 m/s
Area-P 1/2: 3.53 cm2
Calc EF: 40.2 %
S' Lateral: 5 cm
Single Plane A2C EF: 39.9 %
Single Plane A4C EF: 40.2 %

## 2024-10-14 ENCOUNTER — Encounter
Admission: RE | Admit: 2024-10-14 | Discharge: 2024-10-14 | Disposition: A | Discharge status: Home / Self Care | Source: Ambulatory Visit | Attending: Cardiology | Admitting: Cardiology

## 2024-10-14 DIAGNOSIS — I493 Ventricular premature depolarization: Secondary | ICD-10-CM | POA: Diagnosis present

## 2024-10-14 LAB — NM MYOCAR MULTI W/SPECT W/WALL MOTION / EF
LV dias vol: 109 mL (ref 62–150)
LV sys vol: 61 mL
Nuc Stress EF: 44 %
Peak HR: 81 {beats}/min
Rest HR: 67 {beats}/min
Rest Nuclear Isotope Dose: 11.2 mCi
SDS: 0
SRS: 17
SSS: 8
ST Depression (mm): 0 mm
Stress Nuclear Isotope Dose: 31.6 mCi
TID: 1.12

## 2024-10-14 MED ORDER — REGADENOSON 0.4 MG/5ML IV SOLN
0.4000 mg | Freq: Once | INTRAVENOUS | Status: AC
  Administered 2024-10-14: 0.4 mg via INTRAVENOUS

## 2024-10-14 MED ORDER — TECHNETIUM TC 99M TETROFOSMIN IV KIT
31.6100 | PACK | Freq: Once | INTRAVENOUS | Status: AC | PRN
  Administered 2024-10-14: 31.61 via INTRAVENOUS

## 2024-10-14 MED ORDER — TECHNETIUM TC 99M TETROFOSMIN IV KIT
11.1700 | PACK | Freq: Once | INTRAVENOUS | Status: AC | PRN
  Administered 2024-10-14: 11.17 via INTRAVENOUS

## 2024-10-15 ENCOUNTER — Ambulatory Visit: Payer: Self-pay | Admitting: Cardiology

## 2024-10-15 NOTE — Progress Notes (Signed)
 Stress test is considered intermediate risk and is considered an abnormal pharmacologic myocardial perfusion stress test.  There is segments that are most consistent with infarct and peri-infarct ischemia.  Recommend moving follow-up appointment to discuss results further and next steps.

## 2024-10-21 ENCOUNTER — Encounter: Payer: Self-pay | Admitting: Cardiology

## 2024-10-21 ENCOUNTER — Ambulatory Visit: Attending: Cardiology | Admitting: Cardiology

## 2024-10-21 VITALS — BP 124/76 | HR 74 | Ht 72.0 in | Wt 216.2 lb

## 2024-10-21 DIAGNOSIS — R9439 Abnormal result of other cardiovascular function study: Secondary | ICD-10-CM

## 2024-10-21 DIAGNOSIS — I493 Ventricular premature depolarization: Secondary | ICD-10-CM

## 2024-10-21 DIAGNOSIS — R9431 Abnormal electrocardiogram [ECG] [EKG]: Secondary | ICD-10-CM

## 2024-10-21 DIAGNOSIS — N200 Calculus of kidney: Secondary | ICD-10-CM

## 2024-10-21 DIAGNOSIS — I452 Bifascicular block: Secondary | ICD-10-CM

## 2024-10-21 DIAGNOSIS — I10 Essential (primary) hypertension: Secondary | ICD-10-CM | POA: Diagnosis not present

## 2024-10-21 DIAGNOSIS — R5383 Other fatigue: Secondary | ICD-10-CM

## 2024-10-21 MED ORDER — METOPROLOL SUCCINATE ER 25 MG PO TB24: 12.5000 mg | ORAL_TABLET | Freq: Every day | ORAL | 3 refills | Status: DC

## 2024-10-21 NOTE — H&P (View-Only) (Signed)
 " Cardiology Office Note   Date:  10/21/2024  ID:  Caleb Maldonado, DOB 19-Jul-1936, MRN [id number] PCP: Caleb Amis, MD  [facility] Providers Cardiologist:  None Cardiology APP:  Caleb Frederick, NP     History of Present Illness Caleb Maldonado is a 89 y.o. 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   He was evaluated in the [facility] emergency department 04/20/2024 with a complaint of abdominal pain and that had been ongoing for 3 to 4 days.  He also had nausea and vomiting.  Blood pressure was elevated 156/99.  CT scan showed 4 mm proximal right ureteral stone likely the cause of his symptoms.  He was started on Flomax , Zofran , and Percocet to follow-up with urology.  He was subsequently discharged.   He followed up with urology and was to have shockwave lithotripsy on 09/04/2024 of.  He had failed prolonged trial of medical expulsion therapy.  He was noted to have an irregular heart rate prior to beginning of treatment and EKG being ordered and showed a new first-degree AV block with occasional PVCs, left axis deviation, and a right bundle branch block (that was chronic) and his procedure was canceled until he was cleared from cardiology.  He was last seen in clinic 09/10/2024 accompanied by 2 family members.  For cardiac perspective he stated he had done well he denied chest pain, tightness or palpitations.  Stated he had recently followed with his PCP and was advised that his potassium was really low.  Unfortunately he declined potassium supplements and wanted to increase his potassium in his diet.  Blood pressures were elevated.  He was placed on a ZIO XT monitor for 2 weeks to assess for PVC burden.  He was sent for updated labs.  He had a large burden on his ZIO XT monitor  with 28% of nonsustained ventricular tachycardia he was referred to EP and was recommended for an ischemic evaluation with a Lexiscan  Myoview .   He returns to clinic today stating that overall he has been doing fairly well.  He is accompanied by 2 family members today.  Denies any chest pain or chest tightness or palpitations.  States that he has fatigue with minimal exertion.  Recently followed up by his primary care provider was advised he had low potassium which had resolved with dietary supplements.  He recently had monitoring which revealed a high PVC burden and he underwent ischemic evaluation which unfortunately was abnormal.  He was to have a procedure for his kidney stones with shockwave lithotripsy after his cardiology workup had been completed.  Unfortunately with findings from his testing he has yet to be able to have his procedure completed.  He states he has been compliant with his current medication regimen.  Denies any hospitalizations or visits to the emergency department.  ROS: 10 point review of system has been reviewed and considered negative exception was been listed in the HPI  Studies Reviewed EKG Interpretation Date/Time:  Tuesday October 21 2024 14:21:44 EST Ventricular Rate:  74 PR Interval:  180 QRS Duration:  178 QT Interval:  590 QTC Calculation: 654 R Axis:   -70  Text Interpretation: Sinus rhythm with frequent Premature ventricular complexes Left axis deviation Right bundle branch block When compared with ECG of 10-Sep-2024 15:24, Confirmed by Caleb Maldonado ([id number]) on 10/21/2024  2:33:56 PM    Lexiscan  MPI 10/14/2024   Abnormal pharmacologic myocardial perfusion stress test.   There is a moderate in size, moderate in severity, partially reversible defect involving the mid and apical inferior, mid inferolateral, and apical lateral segments most consistent with infarct and peri-infarct ischemia but cannot exclude an element of artifact.   Left ventricular systolic  function is moderately reduced (LVEF 40-45%) with global hypokinesis.  LVEF may be artifactually low due to frequent PVCs.   Coronary artery calcification and aortic atherosclerosis are noted.   This is an intermediate risk study.  2d echo 10/13/2024  1. Left ventricular ejection fraction, by estimation, is 55 to 60%. The  left ventricle has normal function. The left ventricle has no regional  wall motion abnormalities. Left ventricular diastolic parameters are  consistent with Grade I diastolic  dysfunction (impaired relaxation).   2. Right ventricular systolic function is normal. The right ventricular  size is normal.   3. The mitral valve is normal in structure. Mild mitral valve  regurgitation.   4. The aortic valve is tricuspid. Aortic valve regurgitation is not  visualized.   5. The inferior vena cava is normal in size with greater than 50%  respiratory variability, suggesting right atrial pressure of 3 mmHg.   Event Monitor (ZIO) 10/01/2024   The patient was monitored for 13 days, 23 hours.   The predominant rhythm was sinus with an average rate of 75 bpm (range 55-124 bpm in sinus).   There were rare PACs and frequent PVCs (PVC burden 28%).   527 episodes of nonsustained ventricular tachycardia were observed, lasting up to 8 beats with a maximum rate of 194 bpm.   3 supraventricular runs occurred, lasting up to 11 beats with a maximum rate of 145 bpm.   There was no sustained arrhythmia or prolonged pause.   No patient triggered events were recorded.   Predominantly sinus rhythm with frequent PVCs (28% burden) and numerous episodes of brief nonsustained ventricular tachycardia, as detailed above.  Rare PACs and a few brief supraventricular runs also noted.  Risk Assessment/Calculations           Physical Exam VS:  BP 124/76 (BP Location: Left Arm, Patient Position: Sitting, Cuff Size: Normal)   Pulse 74   Ht 6' (1.829 m)   Wt 216 lb 3.2 oz (98.1 kg)   SpO2 93%   BMI  29.32 kg/m        Wt Readings from Last 3 Encounters:  10/21/24 216 lb 3.2 oz (98.1 kg)  09/10/24 218 lb (98.9 kg)  07/24/24 218 lb (98.9 kg)    GEN: Well nourished, well developed in no acute distress NECK: No JVD; No carotid bruits CARDIAC: Irregularly irregular, I/VI systolic murmur, without rubs or gallops RESPIRATORY:  Clear to auscultation without rales, wheezing or rhonchi  ABDOMEN: Soft, non-tender, non-distended EXTREMITIES:  No edema; No deformity   ASSESSMENT AND PLAN Abnormal Lexiscan  Myoview  completed on 10/14/2024 that showed moderate in size, moderate in severity, partially reversible defect involving the mid to apical inferior and mid inferior lateral and apical lateral segments most consistent with infarct and peri-infarct ischemia but cannot rule out an element of artifact.  Left ventricular systolic function was mildly reduced at 40-45% with global hypokinesis.  LVEF may be artificially low due to frequent PVCs.  Coronary artery calcification and aortic atherosclerosis is noted this is an indeterminant study with abnormal pharmacological myocardial perfusion testing.  With his high PVC burden and abnormal testing he  has been scheduled for a left heart catheterization to rule out any ischemic causes.  Prior labs have been normal.  He has been compliant with his current medication regimen.  Denies any worsening shortness of breath dyspnea on exertion and denies any chest discomfort.  He has been sent for updated labs today to evaluate his CBC and a BMP prior to procedure.  He has been advised he will have to have a driver as well.  Procedure was explained in detail to he and his family who is available today.  Abnormal EKG (bifascicular block) with a chronic right bundle branch block, left anterior fascicular block and first-degree AV block with frequent PVCs in a bigeminal pattern.  He has been started on Toprol -XL 12.5 mg daily previous intolerance to medications.  He has been  referred to EP for high PVC burden.  Hypertension with a blood pressure today 120/76.  Blood pressures remain stable.  He is continued on amlodipine 5 mg daily and lisinopril  40 mg daily.  He has been encouraged to continue to monitor his pressures 1 to 2 hours postmedication administration at home as well.  Ongoing fatigue with minimal exertion for several months some.  He is scheduled for left heart catheterization this coming week to rule out any ischemic causes.  Kidney stones we had previously been scheduled for shockwave therapy and had to be placed on hold due to his irregular heart rhythm and abnormal EKG.  He can be cleared for his procedure potentially depending on the results of follow-up testing.    Informed Consent   Shared Decision Making/Informed Consent The risks [stroke (1 in 1000), death (1 in 1000), kidney failure [usually temporary] (1 in 500), bleeding (1 in 200), allergic reaction [possibly serious] (1 in 200)], benefits (diagnostic support and management of coronary artery disease) and alternatives of a cardiac catheterization were discussed in detail with Mr. Knebel and he is willing to proceed.     Dispo: Patient to return to clinic to see MD/APP 2 to 3 weeks postprocedure or sooner if needed for further evaluation.  Signed, Amai Cappiello, NP   "

## 2024-10-21 NOTE — Progress Notes (Signed)
 " Cardiology Office Note   Date:  10/21/2024  ID:  Caleb Maldonado, DOB 19-Jul-1936, MRN [id number] PCP: Alla Amis, MD  [facility] Providers Cardiologist:  None Cardiology APP:  Caleb Frederick, NP     History of Present Illness Caleb Maldonado is a 89 y.o. 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   He was evaluated in the [facility] emergency department 04/20/2024 with a complaint of abdominal pain and that had been ongoing for 3 to 4 days.  He also had nausea and vomiting.  Blood pressure was elevated 156/99.  CT scan showed 4 mm proximal right ureteral stone likely the cause of his symptoms.  He was started on Flomax , Zofran , and Percocet to follow-up with urology.  He was subsequently discharged.   He followed up with urology and was to have shockwave lithotripsy on 09/04/2024 of.  He had failed prolonged trial of medical expulsion therapy.  He was noted to have an irregular heart rate prior to beginning of treatment and EKG being ordered and showed a new first-degree AV block with occasional PVCs, left axis deviation, and a right bundle branch block (that was chronic) and his procedure was canceled until he was cleared from cardiology.  He was last seen in clinic 09/10/2024 accompanied by 2 family members.  For cardiac perspective he stated he had done well he denied chest pain, tightness or palpitations.  Stated he had recently followed with his PCP and was advised that his potassium was really low.  Unfortunately he declined potassium supplements and wanted to increase his potassium in his diet.  Blood pressures were elevated.  He was placed on a ZIO XT monitor for 2 weeks to assess for PVC burden.  He was sent for updated labs.  He had a large burden on his ZIO XT monitor  with 28% of nonsustained ventricular tachycardia he was referred to EP and was recommended for an ischemic evaluation with a Lexiscan  Myoview .   He returns to clinic today stating that overall he has been doing fairly well.  He is accompanied by 2 family members today.  Denies any chest pain or chest tightness or palpitations.  States that he has fatigue with minimal exertion.  Recently followed up by his primary care provider was advised he had low potassium which had resolved with dietary supplements.  He recently had monitoring which revealed a high PVC burden and he underwent ischemic evaluation which unfortunately was abnormal.  He was to have a procedure for his kidney stones with shockwave lithotripsy after his cardiology workup had been completed.  Unfortunately with findings from his testing he has yet to be able to have his procedure completed.  He states he has been compliant with his current medication regimen.  Denies any hospitalizations or visits to the emergency department.  ROS: 10 point review of system has been reviewed and considered negative exception was been listed in the HPI  Studies Reviewed EKG Interpretation Date/Time:  Tuesday October 21 2024 14:21:44 EST Ventricular Rate:  74 PR Interval:  180 QRS Duration:  178 QT Interval:  590 QTC Calculation: 654 R Axis:   -70  Text Interpretation: Sinus rhythm with frequent Premature ventricular complexes Left axis deviation Right bundle branch block When compared with ECG of 10-Sep-2024 15:24, Confirmed by Caleb Maldonado ([id number]) on 10/21/2024  2:33:56 PM    Lexiscan  MPI 10/14/2024   Abnormal pharmacologic myocardial perfusion stress test.   There is a moderate in size, moderate in severity, partially reversible defect involving the mid and apical inferior, mid inferolateral, and apical lateral segments most consistent with infarct and peri-infarct ischemia but cannot exclude an element of artifact.   Left ventricular systolic  function is moderately reduced (LVEF 40-45%) with global hypokinesis.  LVEF may be artifactually low due to frequent PVCs.   Coronary artery calcification and aortic atherosclerosis are noted.   This is an intermediate risk study.  2d echo 10/13/2024  1. Left ventricular ejection fraction, by estimation, is 55 to 60%. The  left ventricle has normal function. The left ventricle has no regional  wall motion abnormalities. Left ventricular diastolic parameters are  consistent with Grade I diastolic  dysfunction (impaired relaxation).   2. Right ventricular systolic function is normal. The right ventricular  size is normal.   3. The mitral valve is normal in structure. Mild mitral valve  regurgitation.   4. The aortic valve is tricuspid. Aortic valve regurgitation is not  visualized.   5. The inferior vena cava is normal in size with greater than 50%  respiratory variability, suggesting right atrial pressure of 3 mmHg.   Event Monitor (ZIO) 10/01/2024   The patient was monitored for 13 days, 23 hours.   The predominant rhythm was sinus with an average rate of 75 bpm (range 55-124 bpm in sinus).   There were rare PACs and frequent PVCs (PVC burden 28%).   527 episodes of nonsustained ventricular tachycardia were observed, lasting up to 8 beats with a maximum rate of 194 bpm.   3 supraventricular runs occurred, lasting up to 11 beats with a maximum rate of 145 bpm.   There was no sustained arrhythmia or prolonged pause.   No patient triggered events were recorded.   Predominantly sinus rhythm with frequent PVCs (28% burden) and numerous episodes of brief nonsustained ventricular tachycardia, as detailed above.  Rare PACs and a few brief supraventricular runs also noted.  Risk Assessment/Calculations           Physical Exam VS:  BP 124/76 (BP Location: Left Arm, Patient Position: Sitting, Cuff Size: Normal)   Pulse 74   Ht 6' (1.829 m)   Wt 216 lb 3.2 oz (98.1 kg)   SpO2 93%   BMI  29.32 kg/m        Wt Readings from Last 3 Encounters:  10/21/24 216 lb 3.2 oz (98.1 kg)  09/10/24 218 lb (98.9 kg)  07/24/24 218 lb (98.9 kg)    GEN: Well nourished, well developed in no acute distress NECK: No JVD; No carotid bruits CARDIAC: Irregularly irregular, I/VI systolic murmur, without rubs or gallops RESPIRATORY:  Clear to auscultation without rales, wheezing or rhonchi  ABDOMEN: Soft, non-tender, non-distended EXTREMITIES:  No edema; No deformity   ASSESSMENT AND PLAN Abnormal Lexiscan  Myoview  completed on 10/14/2024 that showed moderate in size, moderate in severity, partially reversible defect involving the mid to apical inferior and mid inferior lateral and apical lateral segments most consistent with infarct and peri-infarct ischemia but cannot rule out an element of artifact.  Left ventricular systolic function was mildly reduced at 40-45% with global hypokinesis.  LVEF may be artificially low due to frequent PVCs.  Coronary artery calcification and aortic atherosclerosis is noted this is an indeterminant study with abnormal pharmacological myocardial perfusion testing.  With his high PVC burden and abnormal testing he  has been scheduled for a left heart catheterization to rule out any ischemic causes.  Prior labs have been normal.  He has been compliant with his current medication regimen.  Denies any worsening shortness of breath dyspnea on exertion and denies any chest discomfort.  He has been sent for updated labs today to evaluate his CBC and a BMP prior to procedure.  He has been advised he will have to have a driver as well.  Procedure was explained in detail to he and his family who is available today.  Abnormal EKG (bifascicular block) with a chronic right bundle branch block, left anterior fascicular block and first-degree AV block with frequent PVCs in a bigeminal pattern.  He has been started on Toprol -XL 12.5 mg daily previous intolerance to medications.  He has been  referred to EP for high PVC burden.  Hypertension with a blood pressure today 120/76.  Blood pressures remain stable.  He is continued on amlodipine 5 mg daily and lisinopril  40 mg daily.  He has been encouraged to continue to monitor his pressures 1 to 2 hours postmedication administration at home as well.  Ongoing fatigue with minimal exertion for several months some.  He is scheduled for left heart catheterization this coming week to rule out any ischemic causes.  Kidney stones we had previously been scheduled for shockwave therapy and had to be placed on hold due to his irregular heart rhythm and abnormal EKG.  He can be cleared for his procedure potentially depending on the results of follow-up testing.    Informed Consent   Shared Decision Making/Informed Consent The risks [stroke (1 in 1000), death (1 in 1000), kidney failure [usually temporary] (1 in 500), bleeding (1 in 200), allergic reaction [possibly serious] (1 in 200)], benefits (diagnostic support and management of coronary artery disease) and alternatives of a cardiac catheterization were discussed in detail with Mr. Knebel and he is willing to proceed.     Dispo: Patient to return to clinic to see MD/APP 2 to 3 weeks postprocedure or sooner if needed for further evaluation.  Signed, Amai Cappiello, NP   "

## 2024-10-21 NOTE — Patient Instructions (Signed)
 Medication Instructions:   Your physician recommends the following medication changes.  START TAKING: Metoprolol  Succinate (Toprol  XL) 12.5 mg (1/2 of 25 mg tablet) once daily at night time.    *If you need a refill on your cardiac medications before your next appointment, please call your pharmacy*  Lab Work:  Your provider would like for you to have following labs drawn today BMP, CBC.    If you have labs (blood work) drawn today and your tests are completely normal, you will receive your results only by:  MyChart Message (if you have MyChart) OR  A paper copy in the mail If you have any lab test that is abnormal or we need to change your treatment, we will call you to review the results.  Testing/Procedures:   [facility] [place] [facility] OF . [facility] [place] [address] [place] [address] Dept: [phone] Loc: [phone]  CALIN FANTROY  10/21/2024  You are scheduled for a Cardiac Catheterization on Thursday, January 29 with Dr. [name].  1. Please arrive at the [facility] & Vascular Center Entrance of [facility], [address], [place] [address] at 8:30 AM (This is One hour(s) prior to your procedure time).  Proceed to the Check-In Desk directly inside the entrance.  Procedure Parking: Use the entrance off of the [address] side of the hospital. Turn right upon entering and follow the driveway to parking that is directly in front of the Heart & Vascular Center. There is no valet parking available at this entrance, however there is an awning directly in front of the Heart & Vascular Center for drop off/ pick up for patients.  Special note: Every effort is made to have your procedure done on time. Please understand that emergencies sometimes delay scheduled procedures.  2. Diet: NPO: Nothing to eat OR drink after midnight. (For TEE and Cath the same day)   3. Hydration: You need to be  well hydrated before your procedure. On January 29, you may drink approved liquids (see below) until 2 hours before the procedure, with 16 oz of water as your last intake.   List of approved liquids water, clear juice, clear tea, black coffee, fruit juices, non-citric and without pulp, carbonated beverages, Gatorade, Kool -Aid, plain Jello-O and plain ice popsicles.  4. Labs: You will need to have blood drawn on , January 20 at [facility], Go to 1st desk on your right to register.  Address: [address] [place], [place] [address]  Open: 8am - 5pm  Phone: [phone]. You do not need to be fasting.  5. Medication instructions in preparation for your procedure:   Contrast Allergy: No   Current Outpatient Medications (Cardiovascular):    amLODipine (NORVASC) 5 MG tablet, Take 5 mg by mouth daily.   lisinopril  (ZESTRIL ) 40 MG tablet, Take 40 mg by mouth daily.   metoprolol  succinate (TOPROL  XL) 25 MG 24 hr tablet, Take 0.5 tablets (12.5 mg total) by mouth daily.  Current Outpatient Medications (Analgesics):    oxyCODONE  (ROXICODONE ) 5 MG immediate release tablet, Take 1 tablet (5 mg total) by mouth every 8 (eight) hours as needed.  Current Outpatient Medications (Hematological):    vitamin B-12 (CYANOCOBALAMIN) 100 MCG tablet, Take 100 mcg by mouth daily.  Current Outpatient Medications (Other):    Multiple Vitamins-Minerals (CENTRUM SILVER PO), Take by mouth.   ondansetron  (ZOFRAN -ODT) 4 MG disintegrating tablet, Take 1 tablet (4 mg  total) by mouth every 8 (eight) hours as needed for nausea or vomiting.   tamsulosin  (FLOMAX ) 0.4 MG CAPS capsule, Take 1 capsule (0.4 mg total) by mouth daily. *For reference purposes while preparing patient instructions.   Delete this med list prior to printing instructions for patient.*  On the morning of your procedure, take any morning medicines NOT listed above.  You may use sips of water.  6. Plan to go home the same day, you will  only stay overnight if medically necessary. 7. Bring a current list of your medications and current insurance cards. 8. You MUST have a responsible person to drive you home. 9. Someone MUST be with you the first 24 hours after you arrive home or your discharge will be delayed. 10. Please wear clothes that are easy to get on and off and wear slip-on shoes.  Thank you for allowing us  to care for you!   -- [facility]   Referrals:  None ordered at this time   Please Schedule Initial Visit with EP Provider  Follow-Up:  At [facility], you and your health needs are our priority.  As part of our continuing mission to provide you with exceptional heart care, our providers are all part of one team.  This team includes your primary Cardiologist (physician) and Advanced Practice Providers or APPs (Physician Assistants and Nurse Practitioners) who all work together to provide you with the care you need, when you need it.  Your next appointment:   3 month(s)  Provider:    You may see or one of the following Advanced Practice Providers on your designated Care Team:   Lonni Meager, NP Lesley Maffucci, PA-C Bernardino Bring, PA-C [employer], PA-C Tylene Lunch, NP Barnie Hila, NP    We recommend signing up for the patient portal called MyChart.  Sign up information is provided on this After Visit Summary.  MyChart is used to connect with patients for Virtual Visits (Telemedicine).  Patients are able to view lab/test results, encounter notes, upcoming appointments, etc.  Non-urgent messages can be sent to your provider as well.   To learn more about what you can do with MyChart, go to [email or web address].

## 2024-10-22 ENCOUNTER — Ambulatory Visit: Payer: Self-pay | Admitting: Cardiology

## 2024-10-22 ENCOUNTER — Ambulatory Visit: Admitting: Cardiology

## 2024-10-22 LAB — CBC
Hematocrit: 50.4 % (ref 37.5–51.0)
Hemoglobin: 16.7 g/dL (ref 13.0–17.7)
MCH: 29.7 pg (ref 26.6–33.0)
MCHC: 33.1 g/dL (ref 31.5–35.7)
MCV: 90 fL (ref 79–97)
Platelets: 248 x10E3/uL (ref 150–450)
RBC: 5.62 x10E6/uL (ref 4.14–5.80)
RDW: 14.6 % (ref 11.6–15.4)
WBC: 10.1 x10E3/uL (ref 3.4–10.8)

## 2024-10-22 LAB — BASIC METABOLIC PANEL WITH GFR
BUN/Creatinine Ratio: 16 (ref 10–24)
BUN: 16 mg/dL (ref 8–27)
CO2: 25 mmol/L (ref 20–29)
Calcium: 9.7 mg/dL (ref 8.6–10.2)
Chloride: 105 mmol/L (ref 96–106)
Creatinine, Ser: 1.01 mg/dL (ref 0.76–1.27)
Glucose: 90 mg/dL (ref 70–99)
Potassium: 3.6 mmol/L (ref 3.5–5.2)
Sodium: 145 mmol/L — ABNORMAL HIGH (ref 134–144)
eGFR: 72 mL/min/1.73

## 2024-10-22 NOTE — Progress Notes (Signed)
 Pre procedure labs remain stable. No changes to medication regimen needed at this time.

## 2024-10-22 NOTE — Addendum Note (Signed)
 Addended byBETHA GERARD FREDERICK on: 10/22/2024 07:43 AM   Modules accepted: Orders

## 2024-10-28 ENCOUNTER — Telehealth: Payer: Self-pay | Admitting: *Deleted

## 2024-10-28 NOTE — Telephone Encounter (Signed)
 Cardiac Catheterization scheduled at [facility] for: Thursday October 30, 2024 9:30 AM Arrival time Heart [facility] Vascular Center Entrance at: 8:30 AM [address] [place] [address]  Diet: -Nothing to eat after midnight.  Hydration: -May drink clear liquids until 2 hours (7:30 AM) before the procedure.  Approved liquids: Water , clear tea, black coffee, fruit juices-non-citric and without pulp,Gatorade, plain Jello/popsicles.   -Please drink 16 oz of water  2 hours before procedure.  Medication instructions: -Usual morning medications can be taken including aspirin  81 mg.  Plan to go home the same day, you will only stay overnight if medically necessary.  You must have responsible adult to drive you home.  Someone must be with you the first 24 hours after you arrive home.  Reviewed procedure instructions with patient's wife (DPR), Dagoberto.

## 2024-10-30 ENCOUNTER — Encounter: Payer: Self-pay | Admitting: Cardiology

## 2024-10-30 ENCOUNTER — Other Ambulatory Visit: Payer: Self-pay

## 2024-10-30 ENCOUNTER — Ambulatory Visit
Admission: RE | Admit: 2024-10-30 | Discharge: 2024-10-30 | Disposition: A | Discharge status: Home / Self Care | Attending: Cardiology | Admitting: Cardiology

## 2024-10-30 ENCOUNTER — Encounter
Admission: RE | Disposition: A | Discharge status: Home / Self Care | Payer: Self-pay | Source: Home / Self Care | Attending: Cardiology

## 2024-10-30 DIAGNOSIS — I10 Essential (primary) hypertension: Secondary | ICD-10-CM | POA: Insufficient documentation

## 2024-10-30 DIAGNOSIS — R6889 Other general symptoms and signs: Secondary | ICD-10-CM | POA: Insufficient documentation

## 2024-10-30 DIAGNOSIS — I2510 Atherosclerotic heart disease of native coronary artery without angina pectoris: Secondary | ICD-10-CM | POA: Insufficient documentation

## 2024-10-30 DIAGNOSIS — R9439 Abnormal result of other cardiovascular function study: Secondary | ICD-10-CM | POA: Insufficient documentation

## 2024-10-30 DIAGNOSIS — I493 Ventricular premature depolarization: Secondary | ICD-10-CM | POA: Insufficient documentation

## 2024-10-30 DIAGNOSIS — R5383 Other fatigue: Secondary | ICD-10-CM | POA: Insufficient documentation

## 2024-10-30 DIAGNOSIS — N200 Calculus of kidney: Secondary | ICD-10-CM | POA: Insufficient documentation

## 2024-10-30 DIAGNOSIS — Z79899 Other long term (current) drug therapy: Secondary | ICD-10-CM | POA: Insufficient documentation

## 2024-10-30 MED ORDER — SODIUM CHLORIDE 0.9 % IV SOLN: 250.0000 mL | INTRAVENOUS | Status: DC | PRN

## 2024-10-30 MED ORDER — LIDOCAINE HCL (PF) 1 % IJ SOLN
INTRAMUSCULAR | Status: DC | PRN
  Administered 2024-10-30: 5 mL

## 2024-10-30 MED ORDER — SODIUM CHLORIDE 0.9% FLUSH: 3.0000 mL | INTRAVENOUS | Status: DC | PRN

## 2024-10-30 MED ORDER — FENTANYL CITRATE (PF) 100 MCG/2ML IJ SOLN
INTRAMUSCULAR | Status: AC
  Filled 2024-10-30: qty 2

## 2024-10-30 MED ORDER — HEPARIN (PORCINE) IN NACL 2000-0.9 UNIT/L-% IV SOLN
INTRAVENOUS | Status: DC | PRN
  Administered 2024-10-30: 1000 mL

## 2024-10-30 MED ORDER — VERAPAMIL HCL 2.5 MG/ML IV SOLN
INTRAVENOUS | Status: DC | PRN
  Administered 2024-10-30: 2.5 mg via INTRA_ARTERIAL

## 2024-10-30 MED ORDER — LABETALOL HCL 5 MG/ML IV SOLN: 10.0000 mg | INTRAVENOUS | Status: DC | PRN

## 2024-10-30 MED ORDER — VERAPAMIL HCL 2.5 MG/ML IV SOLN
INTRAVENOUS | Status: AC
  Filled 2024-10-30: qty 2

## 2024-10-30 MED ORDER — MIDAZOLAM HCL 2 MG/2ML IJ SOLN
INTRAMUSCULAR | Status: AC
  Filled 2024-10-30: qty 2

## 2024-10-30 MED ORDER — SODIUM CHLORIDE 0.9% FLUSH: 3.0000 mL | Freq: Two times a day (BID) | INTRAVENOUS | Status: DC

## 2024-10-30 MED ORDER — HEPARIN SODIUM (PORCINE) 1000 UNIT/ML IJ SOLN
INTRAMUSCULAR | Status: DC | PRN
  Administered 2024-10-30: 5000 [IU] via INTRAVENOUS

## 2024-10-30 MED ORDER — LIDOCAINE HCL 1 % IJ SOLN
INTRAMUSCULAR | Status: AC
  Filled 2024-10-30: qty 20

## 2024-10-30 MED ORDER — ONDANSETRON HCL 4 MG/2ML IJ SOLN: 4.0000 mg | Freq: Four times a day (QID) | INTRAMUSCULAR | Status: DC | PRN

## 2024-10-30 MED ORDER — HEPARIN SODIUM (PORCINE) 1000 UNIT/ML IJ SOLN
INTRAMUSCULAR | Status: AC
  Filled 2024-10-30: qty 10

## 2024-10-30 MED ORDER — ASPIRIN 81 MG PO CHEW: 81.0000 mg | CHEWABLE_TABLET | ORAL | Status: DC

## 2024-10-30 MED ORDER — HYDRALAZINE HCL 20 MG/ML IJ SOLN: 10.0000 mg | INTRAMUSCULAR | Status: DC | PRN

## 2024-10-30 MED ORDER — ACETAMINOPHEN 325 MG PO TABS: 650.0000 mg | ORAL_TABLET | ORAL | Status: DC | PRN

## 2024-10-30 MED ORDER — IOHEXOL 300 MG/ML  SOLN
INTRAMUSCULAR | Status: DC | PRN
  Administered 2024-10-30: 50 mL

## 2024-10-30 MED ORDER — FREE WATER: 500.0000 mL | Freq: Once | Status: DC

## 2024-10-30 MED ORDER — HEPARIN (PORCINE) IN NACL 1000-0.9 UT/500ML-% IV SOLN
INTRAVENOUS | Status: AC
  Filled 2024-10-30: qty 1000

## 2024-10-30 NOTE — Progress Notes (Signed)
 Dr. Anner in at bedside, speaking with pt., wife and daughter re: cath results. Family verbalizes understanding of conversation with MD.

## 2024-10-30 NOTE — Progress Notes (Signed)
 Pt. Admitted to precath area in [facility]. Pt. Confused, not aware of situation, month, year or president. Pt. Able to state his previous employment and address. Pt. Wife at bedside, stating his memory has been this way for about a year. I told the primary doctor and he said that's normal for his age.  States has not had a neuro consult for pt. Sometimes he might tell you 5 or 6 times. He can't remember something that recently happened, but he remembers things from a long time ago.(Per pt. Wife). Pt. States I get tired a lot quicker than I used to.

## 2024-10-30 NOTE — Progress Notes (Signed)
 Dr. Anner in at bedside, speaking with pt. And his wife/daughter/son-in-law.

## 2024-10-30 NOTE — Discharge Instructions (Signed)
No sedation given for procedure.  

## 2024-10-30 NOTE — Interval H&P Note (Signed)
 History and Physical Interval Note:  10/30/2024 9:12 AM  Caleb Maldonado  has presented today for surgery, with the diagnosis of L Cath   Abnormal stress test.  The various methods of treatment have been discussed with the patient and family. After consideration of risks, benefits and other options for treatment, the patient has consented to  Procedures: LEFT HEART CATH AND CORONARY ANGIOGRAPHY (Left) PERCUTANEOUS CORONARY INTERVENTION    as a surgical intervention.  The patient's history has been reviewed, patient examined, no change in status, stable for surgery.  I have reviewed the patient's chart and labs.  Questions were answered to the patient's satisfaction.    Cath Lab Visit (complete for each Cath Lab visit)  Clinical Evaluation Leading to the Procedure:   ACS: No.  Non-ACS:    Anginal Classification: No Symptoms - Atypical Sx - exercise intolerance/fatigue - NO CP  Anti-ischemic medical therapy: Minimal Therapy (1 class of medications)  Non-Invasive Test Results: Intermediate-risk stress test findings: cardiac mortality 1-3%/year  Prior CABG: No previous CABG     Caleb Maldonado

## 2024-10-31 ENCOUNTER — Encounter: Payer: Self-pay | Admitting: Cardiology

## 2024-11-05 ENCOUNTER — Ambulatory Visit: Payer: Self-pay | Admitting: Cardiology

## 2024-11-06 NOTE — Progress Notes (Unsigned)
 "     Electrophysiology Clinic Note    Date:  11/07/2024  Patient ID:  Caleb Maldonado, [place] Feb 14, 1936, MRN [id number] PCP:  Alla Amis, MD  Cardiologist:  None  Cardiology APP:  Gerard Frederick, NP     Discussed the use of AI scribe software for clinical note transcription with the patient, who gave verbal consent to proceed.   Patient Profile    Chief Complaint: PVC  History of Present Illness: Caleb Maldonado is a 89 y.o. male with PMH notable for PVC, RBBB, LAFB, HTN, h/o adenocarcinoma, GI bleed; seen today for EP consult of PVCs.   His PVCs were incidentally diagnosed prior to shockwave lithotripsy in December 2025. 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 He was referred to cardiology at that time. He saw NP Hammock 09/2024 where he felt well without chest pain or discomfort. He did endorse fatigue with minimal exertion. A 2 week zio at that visit was ordered and showed 28% PVC burden. TTE was overall reassuring. Because of elevated PVCs, a lexiscan  myoview  was ordered that showed partially reversible defect and a LHC was subsequently ordered. Sheri started low dose toprol .  LHC completed last week that showed minimal CAD.   Today, he has not noticed any change in how he feels since starting Toprol .  He confirms he is taking 12.5 mg nightly as recommended by Frederick.  He is not having increased lightheadedness, dizziness, or worsening fatigue.  He checks blood pressure diligently and recalls home readings 120-130s systolic.  Does not recall his pulse.  Endorses being less active as usual, chalked it up to cold weather with increased winter precipitation.  He continues to deny chest pain, chest pressure, palpitations.  He has no snoring at night, but does have daytime fatigue.  No witnessed apneic events.  His daughter and wife joined for visit and contribute to HPI.  Arrhythmia/Device History No specialty comments available.    ROS:  Please see the history of present  illness. All other systems are reviewed and otherwise negative.    Physical Exam    VS:  BP 116/62 (BP Location: Left Arm, Patient Position: Sitting, Cuff Size: Large)   Pulse 65   Ht 6' (1.829 m)   Wt 218 lb (98.9 kg)   SpO2 98%   BMI 29.57 kg/m  BMI: Body mass index is 29.57 kg/m.    STOP-Bang Score:  5         Wt Readings from Last 3 Encounters:  11/07/24 218 lb (98.9 kg)  10/30/24 216 lb 4.8 oz (98.1 kg)  10/21/24 216 lb 3.2 oz (98.1 kg)      GEN- The patient is well appearing, alert and oriented x 3 today.   Lungs- Clear to ausculation bilaterally, normal work of breathing.  Heart- Regularly irregular rate , no murmurs, rubs or gallops Extremities- No peripheral edema, warm, dry   Studies Reviewed   Previous EP, cardiology notes.    EKG is ordered. Personal review of EKG from today shows:    EKG Interpretation Date/Time:  Friday November 07 2024 09:13:32 EST Ventricular Rate:  65 PR Interval:  218 QRS Duration:  186 QT Interval:  496 QTC Calculation: 515 R Axis:   -68  Text Interpretation: Sinus rhythm with 1st degree A-V block with frequent Premature ventricular complexes in a pattern of bigeminy Left axis deviation Right bundle branch block no change from prior Confirmed by Sharren Schnurr [phone]) on 11/07/2024 9:23:37 AM  Rhythm strip - bigeminal PVCs throughout   LHC, 10/30/2024   Mid RCA lesion is 20% stenosed.   Dist RCA lesion is 20% stenosed.   LV end diastolic pressure is moderately elevated.   There is no aortic valve stenosis.  Myocardial spect, 10/14/2024   Abnormal pharmacologic myocardial perfusion stress test.   There is a moderate in size, moderate in severity, partially reversible defect involving the mid and apical inferior, mid inferolateral, and apical lateral segments most consistent with infarct and peri-infarct ischemia but cannot exclude an element of artifact.   Left ventricular systolic function is moderately reduced (LVEF 40-45%)  with global hypokinesis.  LVEF may be artifactually low due to frequent PVCs.   Coronary artery calcification and aortic atherosclerosis are noted.   This is an intermediate risk study.  TTE, 10/13/2024  1. Left ventricular ejection fraction, by estimation, is 55 to 60%. The left ventricle has normal function. The left ventricle has no regional wall motion abnormalities. Left ventricular diastolic parameters are consistent with Grade I diastolic dysfunction (impaired relaxation).   2. Right ventricular systolic function is normal. The right ventricular size is normal.   3. The mitral valve is normal in structure. Mild mitral valve regurgitation.   4. The aortic valve is tricuspid. Aortic valve regurgitation is not visualized.   5. The inferior vena cava is normal in size with greater than 50% respiratory variability, suggesting right atrial pressure of 3 mmHg.   Long term monitor, 10/01/2024   The patient was monitored for 13 days, 23 hours.   The predominant rhythm was sinus with an average rate of 75 bpm (range 55-124 bpm in sinus).   There were rare PACs and frequent PVCs (PVC burden 28%).   527 episodes of nonsustained ventricular tachycardia were observed, lasting up to 8 beats with a maximum rate of 194 bpm.   3 supraventricular runs occurred, lasting up to 11 beats with a maximum rate of 145 bpm.   There was no sustained arrhythmia or prolonged pause.   No patient triggered events were recorded.   Predominantly sinus rhythm with frequent PVCs (28% burden) and numerous episodes of brief nonsustained ventricular tachycardia, as detailed above.  Rare PACs and a few brief supraventricular runs also noted.   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  With normal EF and no significant coronary artery disease, we discussed that goal of treating PVCs is to reduce symptoms of which he appears to have very minimal symptoms. He was recently started on low-dose Toprol  at 12.5 mg nightly, appears to be tolerating very well without any off target effects Will increase Toprol  to 25 mg daily.  I encouraged him to continue monitoring blood pressure and pulse regularly and to notify office if systolic blood pressures consistently 90-100s Encouraged increase physical activity as tolerated  #) HTN Normotensive in office and by home readings Increasing Toprol  as above Consider reducing amlodipine if becomes hypotensive.  #) suspected OSA Stop bang score = 5 Will refer to pulm for further eval/treatment       Current medicines are reviewed at length with the patient today.   The patient does not have concerns  regarding his medicines.  The following changes were made today:   INCREASE toprol  to 25mg  (whole pill) nightly  Labs/ tests ordered today include:  Orders Placed This Encounter  Procedures   Ambulatory referral to Pulmonology   EKG 12-Lead     Disposition: Follow up with Dr. Kennyth or EP APP 6 weeks    Signed, Chantal Needle, NP  11/07/24  9:50 AM  Electrophysiology [facility] "

## 2024-11-07 ENCOUNTER — Encounter: Payer: Self-pay | Admitting: Cardiology

## 2024-11-07 ENCOUNTER — Encounter: Payer: Self-pay | Admitting: Internal Medicine

## 2024-11-07 ENCOUNTER — Ambulatory Visit: Admitting: Cardiology

## 2024-11-07 VITALS — BP 116/62 | HR 65 | Ht 72.0 in | Wt 218.0 lb

## 2024-11-07 DIAGNOSIS — I10 Essential (primary) hypertension: Secondary | ICD-10-CM

## 2024-11-07 DIAGNOSIS — I4510 Unspecified right bundle-branch block: Secondary | ICD-10-CM

## 2024-11-07 DIAGNOSIS — I440 Atrioventricular block, first degree: Secondary | ICD-10-CM

## 2024-11-07 DIAGNOSIS — R29818 Other symptoms and signs involving the nervous system: Secondary | ICD-10-CM

## 2024-11-07 DIAGNOSIS — I493 Ventricular premature depolarization: Secondary | ICD-10-CM

## 2024-11-07 MED ORDER — METOPROLOL SUCCINATE ER 25 MG PO TB24: 25.0000 mg | ORAL_TABLET | Freq: Every day | ORAL | 3 refills | Status: DC

## 2024-11-07 MED ORDER — METOPROLOL SUCCINATE ER 25 MG PO TB24: 25.0000 mg | ORAL_TABLET | Freq: Every day | ORAL | 3 refills | Status: AC

## 2024-11-07 NOTE — Patient Instructions (Signed)
 Medication Instructions:  Your physician recommends the following medication changes.   INCREASE: Metoprolol  to one whole tablet of 25 mg nightly.  *If you need a refill on your cardiac medications before your next appointment, please call your pharmacy*  Lab Work: No labs ordered today    Testing/Procedures: Referral to Pulmonology sent, expect to hear from their Team for an appointment.  Increase Physical activity.  Home Blood Pressure Log for Blood Pressure and Pulse.  Follow-Up: At [facility], you and your health needs are our priority.  As part of our continuing mission to provide you with exceptional heart care, our providers are all part of one team.  This team includes your primary Cardiologist (physician) and Advanced Practice Providers or APPs (Physician Assistants and Nurse Practitioners) who all work together to provide you with the care you need, when you need it.  Your next appointment:   6 week(s)  Provider:   Suzann Riddle, NP

## 2024-12-19 ENCOUNTER — Ambulatory Visit: Admitting: Cardiology

## 2025-01-19 ENCOUNTER — Ambulatory Visit: Admitting: Student

## 2025-05-05 ENCOUNTER — Ambulatory Visit: Admitting: Dermatology
